# Patient Record
Sex: Male | Born: 1985 | Race: White | Hispanic: No | Marital: Married | Smoking: Former smoker
Health system: Southern US, Community
[De-identification: ages and names within clinical notes are randomized; demographics above are authoritative.]

## PROBLEM LIST (undated history)

## (undated) DIAGNOSIS — J45909 Unspecified asthma, uncomplicated: Secondary | ICD-10-CM

## (undated) HISTORY — DX: Unspecified asthma, uncomplicated: J45.909

---
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
## 2008-07-25 ENCOUNTER — Ambulatory Visit: Payer: Self-pay | Admitting: Internal Medicine

## 2008-08-28 ENCOUNTER — Ambulatory Visit: Payer: Self-pay | Admitting: Family Medicine

## 2008-11-24 ENCOUNTER — Emergency Department: Payer: Self-pay | Admitting: Emergency Medicine

## 2009-01-05 ENCOUNTER — Emergency Department: Payer: Self-pay | Admitting: Emergency Medicine

## 2009-04-01 ENCOUNTER — Ambulatory Visit: Payer: Self-pay | Admitting: Family Medicine

## 2009-04-03 ENCOUNTER — Ambulatory Visit: Payer: Self-pay | Admitting: Internal Medicine

## 2012-05-29 ENCOUNTER — Ambulatory Visit: Payer: Self-pay | Admitting: Specialist

## 2013-10-24 DIAGNOSIS — J4520 Mild intermittent asthma, uncomplicated: Secondary | ICD-10-CM | POA: Insufficient documentation

## 2014-05-20 IMAGING — CT CT CHEST W/O CM
1 series · 16 of 33 positions shown, 20 images · non-contrast
Comparison: none

REASON FOR EXAM: lung density
COMMENTS:

[Series 2: soft tissue · axial · 0.75mm/px · z∈[-144,+208]mm · 16 of 127 slices shown, 20 images]
[im 5/127  mediastinal]
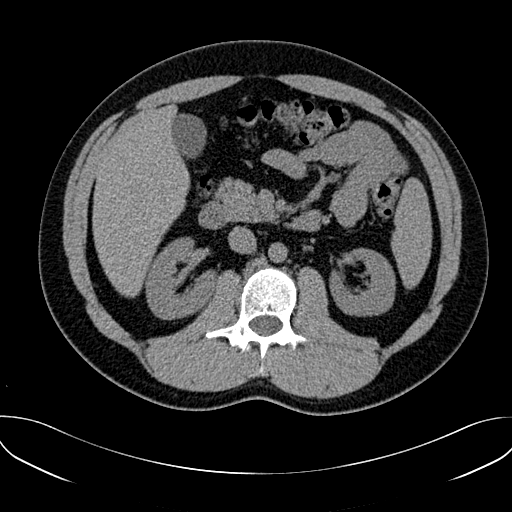
[im 5/127  lung]
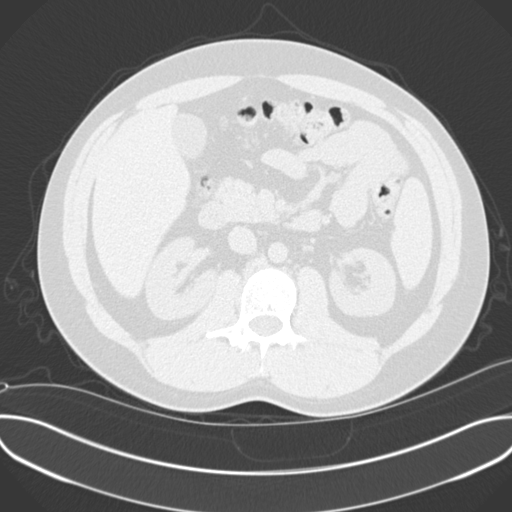
[im 15/127  lung]
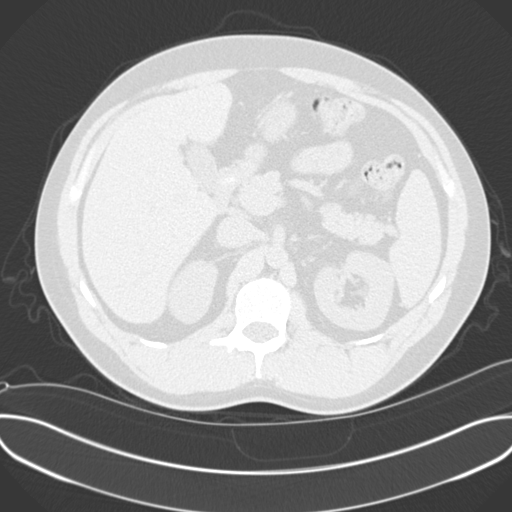
[im 24/127  lung]
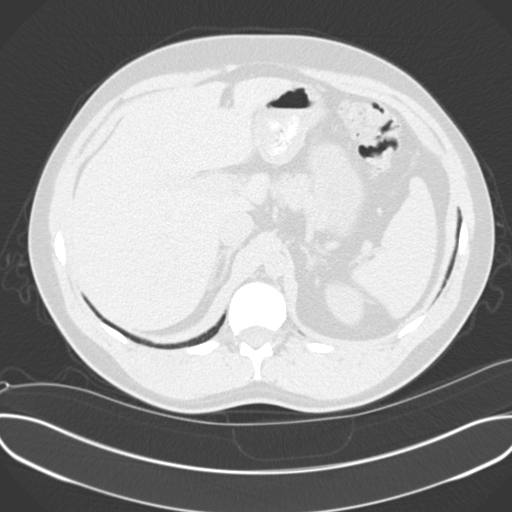
[im 29/127  lung]
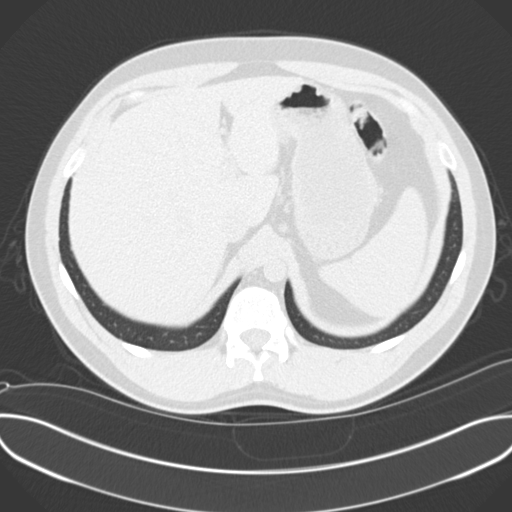
[im 38/127  mediastinal]
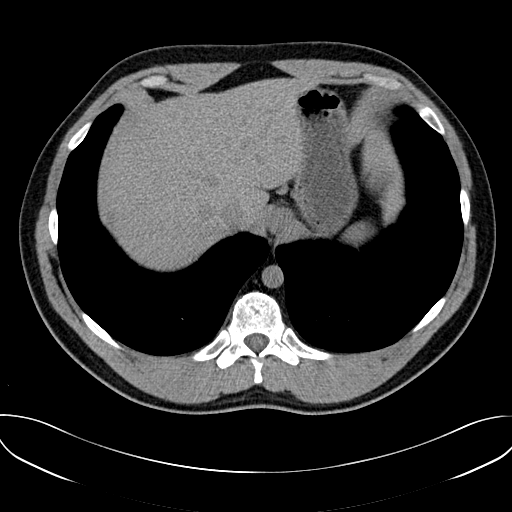
[im 38/127  lung]
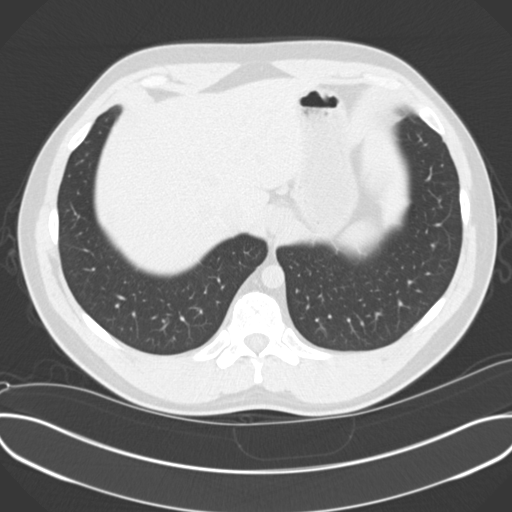
[im 47/127  lung]
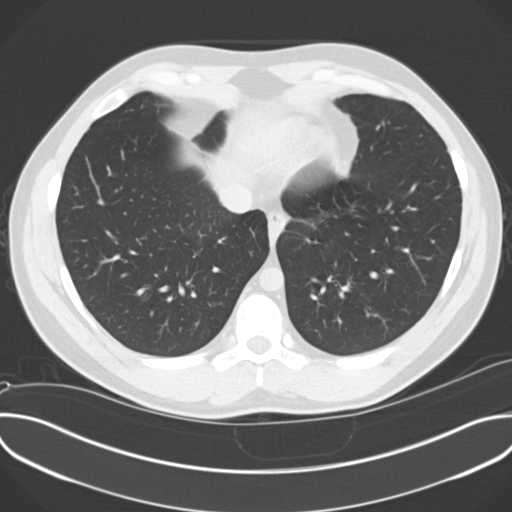
[im 52/127  lung]
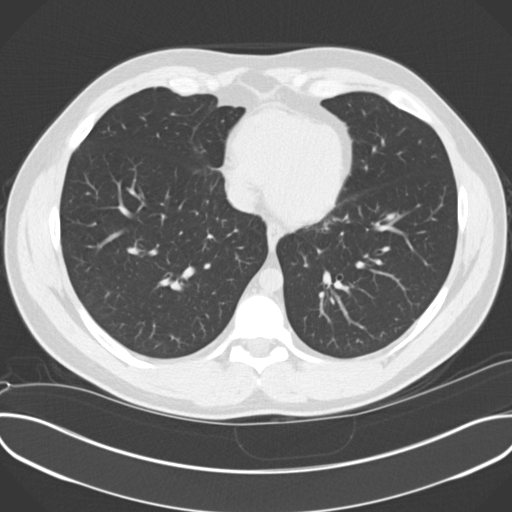
[im 61/127  lung]
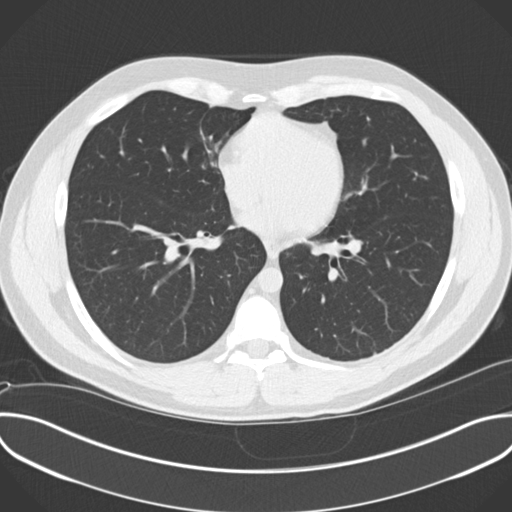
[im 67/127  mediastinal]
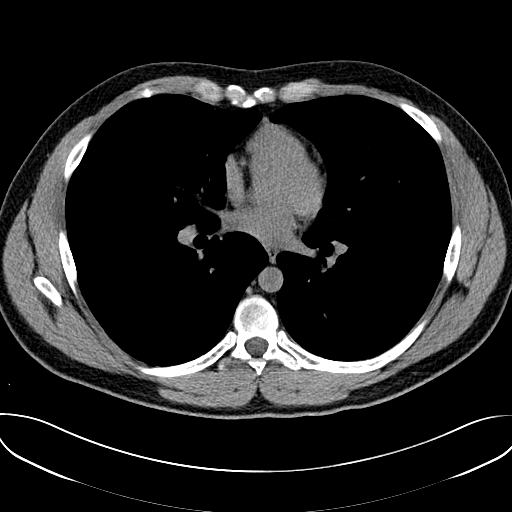
[im 67/127  lung]
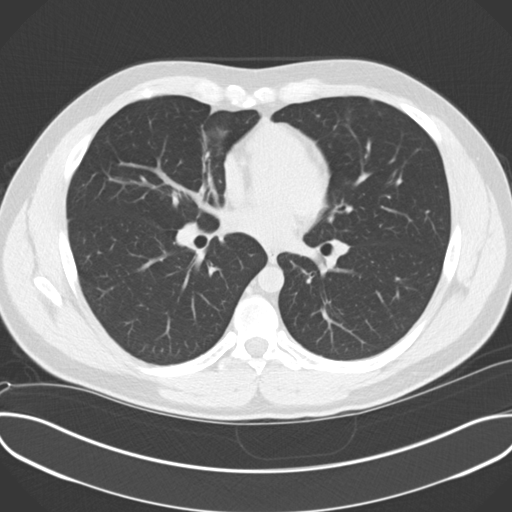
[im 75/127  lung]
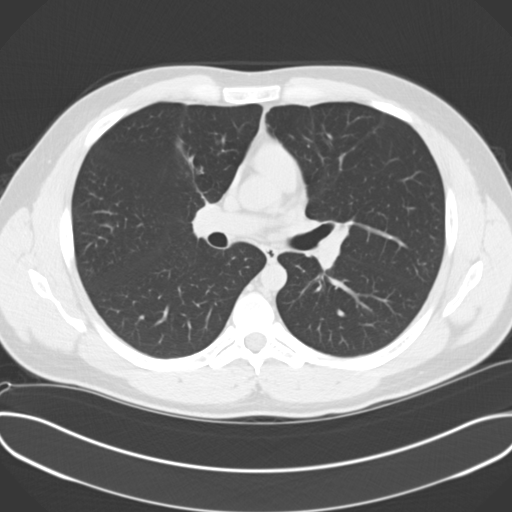
[im 80/127  lung]
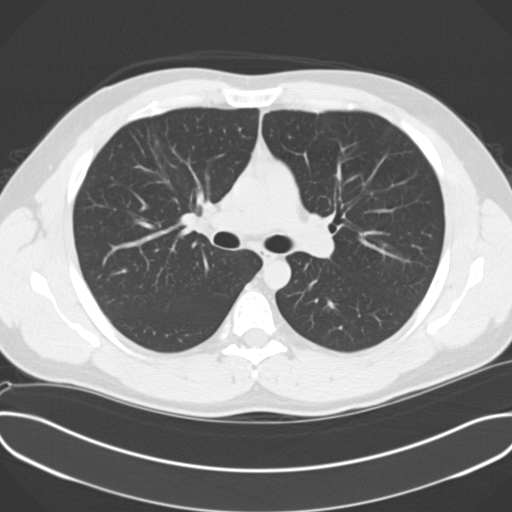
[im 89/127  lung]
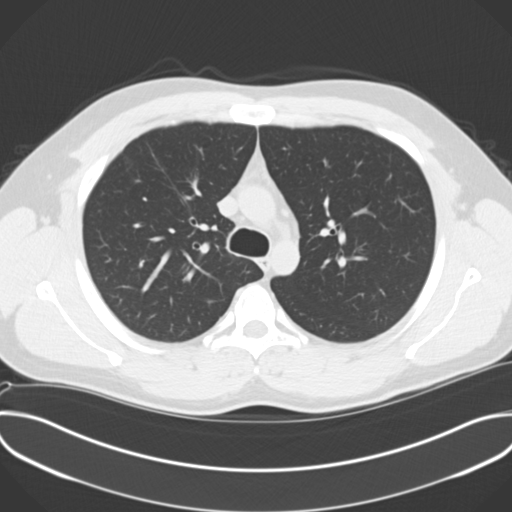
[im 99/127  mediastinal]
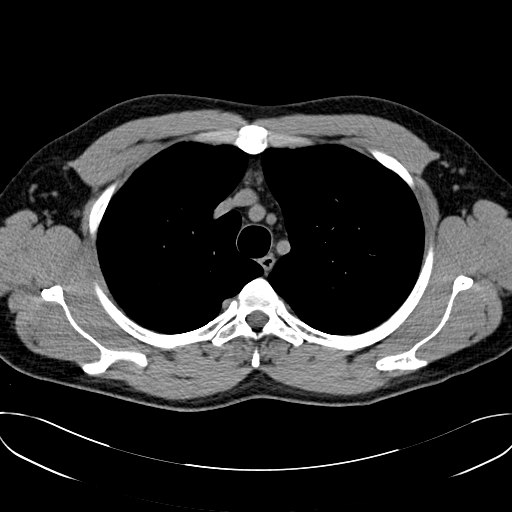
[im 99/127  lung]
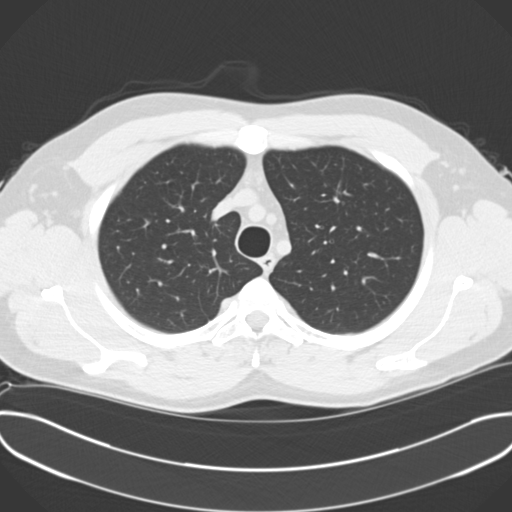
[im 103/127  lung]
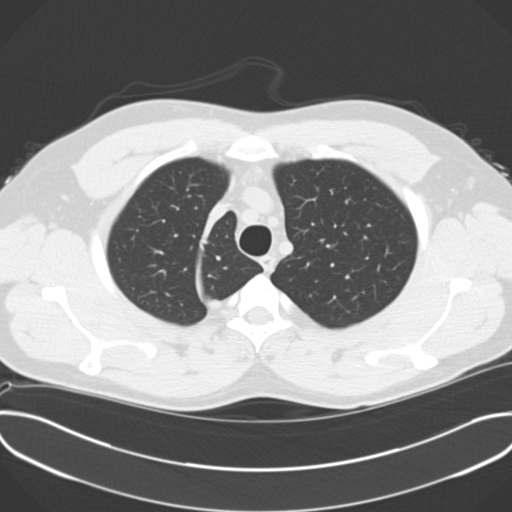
[im 113/127  lung]
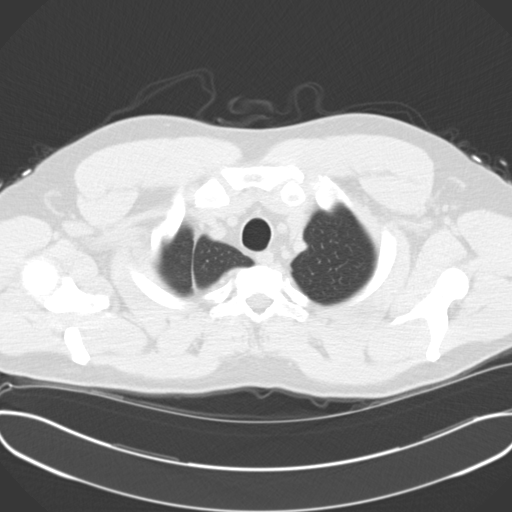
[im 122/127  lung]
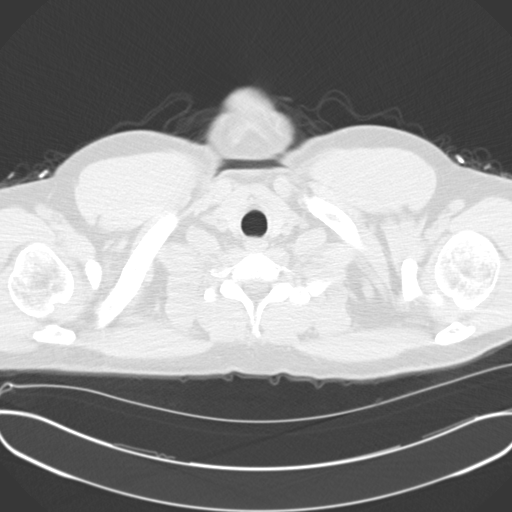

[16 of 33 positions shown; findings below may reference images not displayed]

PROCEDURE:     CT  - CT CHEST WITHOUT CONTRAST  - May 29, 2012  [DATE]

RESULT:     Chest CT without contrast is reconstructed in the axial plane at
3 mm slice thickness. There is no recent study for comparison.

There is no pleural or pericardial effusion. There is no mediastinal or
hilar mass or adenopathy. Is no hiatal hernia. The esophagus appears to be
unremarkable. There is an azygos lobe configuration. There appears to be a
band of thickening in the right upper lobe which could represent fibrosis.
There is no discrete mass. There is no infiltrate, effusion or pneumothorax.
No central endobronchial lesion is evident. The included upper abdominal
structures appear unremarkable. The included portions of the thyroid gland
appear to be unremarkable. No axillary adenopathy is evident. The bony
structures appear within normal limits.
IMPRESSION: 1. Azygos lobe configuration. Presumed fibrosis in the right upper lobe. No
discrete mass evident.

[REDACTED]

## 2016-07-01 ENCOUNTER — Ambulatory Visit: Payer: Self-pay | Admitting: Medical

## 2016-07-01 ENCOUNTER — Encounter: Payer: Self-pay | Admitting: Medical

## 2016-07-01 VITALS — BP 125/78 | HR 95 | Temp 98.5°F | Resp 16 | Ht 70.0 in | Wt 194.0 lb

## 2016-07-01 DIAGNOSIS — J4520 Mild intermittent asthma, uncomplicated: Secondary | ICD-10-CM

## 2016-07-01 DIAGNOSIS — F419 Anxiety disorder, unspecified: Secondary | ICD-10-CM

## 2016-07-01 NOTE — Progress Notes (Signed)
   Subjective:    Patient ID: Antonio Rollins, male    DOB: 09-01-1985, 31 y.o.   MRN: [id number]  HPI 31 yo male  presents with  Anxiety issues which he says has been worse over the last year. His wife and coworkers have also mentioned his anxiety. He Thinks he has been dealing with this since high school. Leaves for vaction next week returns on the  4th of July. Hx Asthma. He says he does not like doctors and coming into the clinic today is a big step for him.He denies any depression.   2 weeks ago  With runny nose , hot and sweaty now symptoms have resolved.  Review of Systems  HENT: Negative for congestion, ear pain and sore throat.   Eyes: Negative for discharge and itching.  Respiratory: Positive for shortness of breath. Negative for cough.   Cardiovascular: Negative for chest pain.  Gastrointestinal: Negative for diarrhea, nausea and vomiting.  Endocrine: Negative for polydipsia, polyphagia and polyuria.  Genitourinary: Negative for hematuria.  Musculoskeletal: Negative for arthralgias and myalgias.  Skin: Negative for rash.  Allergic/Immunologic: Positive for environmental allergies. Negative for food allergies.  Neurological: Positive for dizziness. Negative for syncope.  Hematological: Negative for adenopathy.  Psychiatric/Behavioral: Negative for confusion, hallucinations, self-injury and suicidal ideas. The patient is nervous/anxious.    Gets sob no wheezing  (mild) with humid/hot outside. Sometimes dizzy with anxiety.    Objective:   Physical Exam  Constitutional: He is oriented to person, place, and time. Vital signs are normal. He appears well-developed and well-nourished.  HENT:  Head: Normocephalic and atraumatic.  Eyes: Conjunctivae and EOM are normal. Pupils are equal, round, and reactive to light.  Neck: Normal range of motion.  Cardiovascular: Normal rate, regular rhythm and normal heart sounds.  Exam reveals no gallop and no friction rub.   No murmur  heard. Pulmonary/Chest: Effort normal and breath sounds normal.  Neurological: He is alert and oriented to person, place, and time.  Skin: Skin is warm and dry.  Psychiatric: He has a normal mood and affect. His behavior is normal. Judgment and thought content normal.  Nursing note and vitals reviewed.  He appears calm to day.       Assessment & Plan:  Anxiety I counseled patient on anxiety today. He feels he gets irritated with the people he works with and that is what causes his anxiety.  For now the patient would like to try [facility] [employer] [facility] counseling services.. I did offer hydroxyzine but he declines. His GAD score today was  13. He is to follow up in 7-10 days for a recheck.

## 2016-07-04 DIAGNOSIS — J45909 Unspecified asthma, uncomplicated: Secondary | ICD-10-CM | POA: Insufficient documentation

## 2016-07-20 ENCOUNTER — Encounter: Payer: Self-pay | Admitting: Medical

## 2016-07-20 ENCOUNTER — Ambulatory Visit: Payer: Self-pay | Admitting: Medical

## 2016-07-20 VITALS — BP 105/60 | HR 88 | Temp 98.6°F | Resp 16 | Ht 70.0 in | Wt 194.0 lb

## 2016-07-20 DIAGNOSIS — F419 Anxiety disorder, unspecified: Secondary | ICD-10-CM

## 2016-07-20 MED ORDER — SERTRALINE HCL 25 MG PO TABS: 25.0000 mg | ORAL_TABLET | Freq: Every day | ORAL | 0 refills | Status: DC

## 2016-07-20 NOTE — Patient Instructions (Addendum)
Schedule for primary care labs and then set up for primary care appointment. One week follow up .

## 2016-07-20 NOTE — Progress Notes (Signed)
   Subjective:    Patient ID: Antonio Rollins, male    DOB: Aug 16, 1985, 31 y.o.   MRN: [id number]  HPI  Returns to day for anxiety issues recheck . [place] family of 12 people. Feels he did have some anxiety, tried doing yoga with little sister but still felt he had a lot of anxieity.  Back  To work last  2 days.  A couple of people at work stress him out he is trying to avoid them but still feels stressed.  Patients wife is on Paxil and feels it works wells.  But has read up on a lot of different SSRI and is willing to try any one I suggest. Sees pulmonologist . Dr Meredeth IdeFleming for asthma.   Review of Systems  Constitutional: Negative for chills and fever.  HENT: Negative for congestion, facial swelling and sore throat.   Eyes: Negative for discharge and itching.  Respiratory: Negative for cough.   Cardiovascular: Negative for chest pain.  Gastrointestinal: Negative for abdominal pain.  Genitourinary: Negative for hematuria.  Musculoskeletal: Negative for back pain.  Skin: Negative for rash.  Neurological: Negative for dizziness and syncope.  Psychiatric/Behavioral: Negative for confusion and hallucinations. The patient is not hyperactive.      Shortness of breath after returning from vacation, used Proair and it resolved. On occasion you get with anxiety, or when the air quality is bad he then also uses his Proair and it resolves.  mild sunburn on shoulders from sunburn from vacation . Azygos lobe unsure of side. ( extra lobe upper right lobe) Objective:   Physical Exam  Constitutional: He is oriented to person, place, and time. He appears well-developed and well-nourished.  HENT:  Head: Normocephalic and atraumatic.  Eyes: Conjunctivae and EOM are normal.  Neck: Normal range of motion.  Cardiovascular: Normal rate, regular rhythm and normal heart sounds.  Exam reveals no gallop and no friction rub.   No murmur heard. Pulmonary/Chest: Effort  normal and breath sounds normal.  Neurological: He is alert and oriented to person, place, and time.  Skin: Skin is warm and dry.  Psychiatric: He has a normal mood and affect. His behavior is normal. Judgment and thought content normal.  Nursing note and vitals reviewed.  Calmly talking to me today.  GAD-7 = 16  3 highter than his last visit 07/01/16.     Assessment & Plan:   Anxiety  E prescribed Zoloft  25 mg one tablet by mouth daily #30 no refills. Reviewed side effects with patient. Follow up in one week. If any concerns with medication to call or come into the clinic sooner.  He verbalizes understanding and has no questions at discharge.

## 2016-07-28 ENCOUNTER — Ambulatory Visit: Payer: Self-pay | Admitting: Medical

## 2016-07-28 ENCOUNTER — Other Ambulatory Visit: Payer: Self-pay

## 2016-07-28 ENCOUNTER — Encounter: Payer: Self-pay | Admitting: Medical

## 2016-07-28 VITALS — BP 98/66 | HR 78 | Temp 98.0°F

## 2016-07-28 DIAGNOSIS — Z0000 Encounter for general adult medical examination without abnormal findings: Secondary | ICD-10-CM

## 2016-07-28 DIAGNOSIS — F419 Anxiety disorder, unspecified: Secondary | ICD-10-CM

## 2016-07-28 LAB — POCT URINALYSIS DIPSTICK
Bilirubin, UA: NEGATIVE
Glucose, UA: NEGATIVE
Ketones, UA: NEGATIVE
Leukocytes, UA: NEGATIVE
Nitrite, UA: NEGATIVE
PH UA: 6 (ref 5.0–8.0)
Protein, UA: NEGATIVE
RBC UA: NEGATIVE
Spec Grav, UA: 1.01 (ref 1.010–1.025)
UROBILINOGEN UA: 0.2 U/dL

## 2016-07-28 NOTE — Patient Instructions (Addendum)
Schedule a  2 week follow up. Sooner if any concerns.   Generalized Anxiety Disorder, Adult Generalized anxiety disorder (GAD) is a mental health disorder. People with this condition constantly worry about everyday events. Unlike normal anxiety, worry related to GAD is not triggered by a specific event. These worries also do not fade or get better with time. GAD interferes with life functions, including relationships, work, and school. GAD can vary from mild to severe. People with severe GAD can have intense waves of anxiety with physical symptoms (panic attacks). What are the causes? The exact cause of GAD is not known. What increases the risk? This condition is more likely to develop in:  Women.  People who have a family history of anxiety disorders.  People who are very shy.  People who experience very stressful life events, such as the death of a loved one.  People who have a very stressful family environment.  What are the signs or symptoms? People with GAD often worry excessively about many things in their lives, such as their health and family. They may also be overly concerned about:  Doing well at work.  Being on time.  Natural disasters.  Friendships.  Physical symptoms of GAD include:  Fatigue.  Muscle tension or having muscle twitches.  Trembling or feeling shaky.  Being easily startled.  Feeling like your heart is pounding or racing.  Feeling out of breath or like you cannot take a deep breath.  Having trouble falling asleep or staying asleep.  Sweating.  Nausea, diarrhea, or irritable bowel syndrome (IBS).  Headaches.  Trouble concentrating or remembering facts.  Restlessness.  Irritability.  How is this diagnosed? Your health care provider can diagnose GAD based on your symptoms and medical history. You will also have a physical exam. The health care provider will ask specific questions about your symptoms, including how severe they are,  when they started, and if they come and go. Your health care provider may ask you about your use of alcohol or drugs, including prescription medicines. Your health care provider may refer you to a mental health specialist for further evaluation. Your health care provider will do a thorough examination and may perform additional tests to rule out other possible causes of your symptoms. To be diagnosed with GAD, a person must have anxiety that:  Is out of his or her control.  Affects several different aspects of his or her life, such as work and relationships.  Causes distress that makes him or her unable to take part in normal activities.  Includes at least three physical symptoms of GAD, such as restlessness, fatigue, trouble concentrating, irritability, muscle tension, or sleep problems.  Before your health care provider can confirm a diagnosis of GAD, these symptoms must be present more days than they are not, and they must last for six months or longer. How is this treated? The following therapies are usually used to treat GAD:  Medicine. Antidepressant medicine is usually prescribed for long-term daily control. Antianxiety medicines may be added in severe cases, especially when panic attacks occur.  Talk therapy (psychotherapy). Certain types of talk therapy can be helpful in treating GAD by providing support, education, and guidance. Options include: ? Cognitive behavioral therapy (CBT). People learn coping skills and techniques to ease their anxiety. They learn to identify unrealistic or negative thoughts and behaviors and to replace them with positive ones. ? Acceptance and commitment therapy (ACT). This treatment teaches people how to be mindful as a way to  cope with unwanted thoughts and feelings. ? Biofeedback. This process trains you to manage your body's response (physiological response) through breathing techniques and relaxation methods. You will work with a therapist while  machines are used to monitor your physical symptoms.  Stress management techniques. These include yoga, meditation, and exercise.  A mental health specialist can help determine which treatment is best for you. Some people see improvement with one type of therapy. However, other people require a combination of therapies. Follow these instructions at home:  Take over-the-counter and prescription medicines only as told by your health care provider.  Try to maintain a normal routine.  Try to anticipate stressful situations and allow extra time to manage them.  Practice any stress management or self-calming techniques as taught by your health care provider.  Do not punish yourself for setbacks or for not making progress.  Try to recognize your accomplishments, even if they are small.  Keep all follow-up visits as told by your health care provider. This is important. Contact a health care provider if:  Your symptoms do not get better.  Your symptoms get worse.  You have signs of depression, such as: ? A persistently sad, cranky, or irritable mood. ? Loss of enjoyment in activities that used to bring you joy. ? Change in weight or eating. ? Changes in sleeping habits. ? Avoiding friends or family members. ? Loss of energy for normal tasks. ? Feelings of guilt or worthlessness. Get help right away if:  You have serious thoughts about hurting yourself or others. If you ever feel like you may hurt yourself or others, or have thoughts about taking your own life, get help right away. You can go to your nearest emergency department or call:  Your local emergency services (911 in the [place]  A suicide crisis helpline, such as the National Suicide Prevention Lifeline at 630-581-20081-210-093-4295. This is open 24 hours a day.  Summary  Generalized anxiety disorder (GAD) is a mental health disorder that involves worry that is not triggered by a specific event.  People with GAD often worry  excessively about many things in their lives, such as their health and family.  GAD may cause physical symptoms such as restlessness, trouble concentrating, sleep problems, frequent sweating, nausea, diarrhea, headaches, and trembling or muscle twitching.  A mental health specialist can help determine which treatment is best for you. Some people see improvement with one type of therapy. However, other people require a combination of therapies. This information is not intended to replace advice given to you by your health care provider. Make sure you discuss any questions you have with your health care provider. Document Released: 05/01/2012 Document Revised: 11/25/2015 Document Reviewed: 11/25/2015 Elsevier Interactive Patient Education  [employer]2018 [employer]

## 2016-07-28 NOTE — Progress Notes (Signed)
   Subjective:    Patient ID: Antonio Rollins, male    DOB: 11/10/1985, 31 y.o.   MRN: [id number]  HPI 31 yo male comes in for recheck of anxiety , started on zoloft one week ago. Feeling better, taking medication, doing yoga, exercising more.  Having trouble with seeing counselor due to time in schedule.  Wife restarting classes,  Studying for her masters in Psychology  and has a daughter  462 yo. Which he has to pick her up  2 times per week. So scheduling is difficulty.  Got hot and sweating after gave the blood for blood work.  Felt dizzy like he was going to pass out,  Laid down and ate a protein bar and had some gatorade and then felt better. Now feels back to his baseline.   Review of Systems  Constitutional: Negative for chills and fever.  HENT: Negative for congestion, ear pain and sore throat.   Eyes: Negative for discharge and itching.  Respiratory: Negative for cough.   Cardiovascular: Negative for chest pain.  Gastrointestinal: Negative for abdominal pain.  Endocrine: Negative for cold intolerance and heat intolerance.  Genitourinary: Negative for dysuria and hematuria.  Musculoskeletal: Negative for myalgias.  Skin: Negative for rash.  Neurological: Positive for dizziness. Negative for syncope.  Hematological: Negative for adenopathy.  Psychiatric/Behavioral: Negative for agitation, behavioral problems, self-injury and suicidal ideas. The patient is nervous/anxious.    With anxiety he was getting short of breath and this seems to have improved.   Anxiety is improving  He says it like  "night and day on how he feels now" feeling much better denies hearing voices. Objective:   Physical Exam  Constitutional: He is oriented to person, place, and time. He appears well-developed and well-nourished.  HENT:  Head: Normocephalic and atraumatic.  Eyes: EOM are normal. Pupils are equal, round, and reactive to light.  Neck: Normal range of motion.  Cardiovascular: Normal rate.   Exam reveals no gallop and no friction rub.   No murmur heard. Pulmonary/Chest: Effort normal and breath sounds normal.  Neurological: He is alert and oriented to person, place, and time.  Skin: Skin is warm and dry.  Psychiatric: He has a normal mood and affect. His behavior is normal. Judgment and thought content normal.  Nursing note and vitals reviewed.     Recheck bp 120 /88 left arm blue cuff    Assessment & Plan:  Anxiety improved he is on Zoloft 25 mg  Per day and he feels this dosage is working well for him. GAD-7 score of 10 , last week he was  16. He is to return to the clinic in 2 weeks for a recheck most likely will need a refill., to follow up sooner if any concerns.

## 2016-07-29 LAB — CMP12+LP+TP+TSH+6AC+CBC/D/PLT
ALT: 44 IU/L (ref 0–44)
AST: 30 IU/L (ref 0–40)
Albumin/Globulin Ratio: 1.7 (ref 1.2–2.2)
Albumin: 4.6 g/dL (ref 3.5–5.5)
Alkaline Phosphatase: 68 IU/L (ref 39–117)
BUN/Creatinine Ratio: 12 (ref 9–20)
BUN: 13 mg/dL (ref 6–20)
Basophils Absolute: 0 10*3/uL (ref 0.0–0.2)
Basos: 0 %
Bilirubin Total: 0.4 mg/dL (ref 0.0–1.2)
Calcium: 9.9 mg/dL (ref 8.7–10.2)
Chloride: 101 mmol/L (ref 96–106)
Chol/HDL Ratio: 5.4 ratio — ABNORMAL HIGH (ref 0.0–5.0)
Cholesterol, Total: 256 mg/dL — ABNORMAL HIGH (ref 100–199)
Creatinine, Ser: 1.09 mg/dL (ref 0.76–1.27)
EOS (ABSOLUTE): 0.3 10*3/uL (ref 0.0–0.4)
Eos: 3 %
Estimated CHD Risk: 1.1 times avg. — ABNORMAL HIGH (ref 0.0–1.0)
Free Thyroxine Index: 1.7 (ref 1.2–4.9)
GFR calc Af Amer: 103 mL/min/{1.73_m2} (ref >59)
GFR calc non Af Amer: 89 mL/min/{1.73_m2} (ref >59)
GGT: 30 IU/L (ref 0–65)
Globulin, Total: 2.7 g/dL (ref 1.5–4.5)
Glucose: 85 mg/dL (ref 65–99)
HDL: 47 mg/dL (ref >39)
Hematocrit: 48.5 % (ref 37.5–51.0)
Hemoglobin: 16.1 g/dL (ref 13.0–17.7)
Immature Grans (Abs): 0 10*3/uL (ref 0.0–0.1)
Immature Granulocytes: 0 %
Iron: 115 ug/dL (ref 38–169)
LDH: 206 IU/L (ref 121–224)
LDL Calculated: 165 mg/dL — ABNORMAL HIGH (ref 0–99)
Lymphocytes Absolute: 3.6 10*3/uL — ABNORMAL HIGH (ref 0.7–3.1)
Lymphs: 35 %
MCH: 29.4 pg (ref 26.6–33.0)
MCHC: 33.2 g/dL (ref 31.5–35.7)
MCV: 89 fL (ref 79–97)
Monocytes Absolute: 0.6 10*3/uL (ref 0.1–0.9)
Monocytes: 6 %
Neutrophils Absolute: 5.7 10*3/uL (ref 1.4–7.0)
Neutrophils: 56 %
Phosphorus: 3.6 mg/dL (ref 2.5–4.5)
Platelets: 261 10*3/uL (ref 150–379)
Potassium: 4.6 mmol/L (ref 3.5–5.2)
RBC: 5.47 x10E6/uL (ref 4.14–5.80)
RDW: 13.3 % (ref 12.3–15.4)
Sodium: 141 mmol/L (ref 134–144)
T3 Uptake Ratio: 22 % — ABNORMAL LOW (ref 24–39)
T4, Total: 7.8 ug/dL (ref 4.5–12.0)
TSH: 1.47 u[IU]/mL (ref 0.450–4.500)
Total Protein: 7.3 g/dL (ref 6.0–8.5)
Triglycerides: 220 mg/dL — ABNORMAL HIGH (ref 0–149)
Uric Acid: 6.6 mg/dL (ref 3.7–8.6)
VLDL Cholesterol Cal: 44 mg/dL — ABNORMAL HIGH (ref 5–40)
WBC: 10.3 10*3/uL (ref 3.4–10.8)

## 2016-07-29 LAB — VITAMIN D 25 HYDROXY (VIT D DEFICIENCY, FRACTURES): Vit D, 25-Hydroxy: 22.6 ng/mL — ABNORMAL LOW (ref 30.0–100.0)

## 2016-08-11 ENCOUNTER — Ambulatory Visit: Payer: Self-pay | Admitting: Medical

## 2016-08-11 DIAGNOSIS — F411 Generalized anxiety disorder: Secondary | ICD-10-CM

## 2016-08-11 NOTE — Progress Notes (Signed)
   Subjective:    Patient ID: Antonio Rollins, male    DOB: 07/01/1985, 31 y.o.   MRN: [id number]  HPI 31 yo male returns for follow up  After being started on Zoloft 25 mg 3.5 weeks ago he decided one week ago to increase dosage to 50 mg per my advice if needed. Helped more with his anxiety. 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 Going bed about 10:30pm trouble going to sleep and waking up about 3-4 times per night. No fatigue during the day.  Groggy in the morning using energy shots  " like 5  Hour energy uses  1/2 of a shot" . Drinking coffee int he morning which is not his usual. Noticed this after increasing the Zoloft.    Review of Systems  Constitutional: Negative for activity change, appetite change, chills, fatigue and fever.  HENT: Negative for congestion, ear pain and sore throat.   Eyes: Negative for discharge, itching and visual disturbance.  Respiratory: Negative for cough and shortness of breath.   Cardiovascular: Negative for chest pain, palpitations and leg swelling.  Gastrointestinal: Negative for abdominal pain and constipation.  Endocrine: Negative for polydipsia, polyphagia and polyuria.  Genitourinary: Negative for dysuria and hematuria.  Musculoskeletal: Negative for myalgias.  Skin: Negative for rash.  Neurological: Negative for dizziness, syncope, light-headedness and headaches.  Hematological: Negative for adenopathy.  Psychiatric/Behavioral: Positive for sleep disturbance. Negative for agitation, behavioral problems, confusion, decreased concentration, dysphoric mood, hallucinations, self-injury and suicidal ideas. The patient is not nervous/anxious and is not hyperactive.   sleep disturbance as above      Objective:   Physical Exam  Constitutional: He is oriented to person, place, and time. He appears well-developed and well-nourished.  HENT:  Head: Normocephalic and atraumatic.  Eyes:  Pupils are equal, round, and reactive to light. Conjunctivae and EOM are normal.  Cardiovascular: Normal rate, regular rhythm and normal heart sounds.  Exam reveals no gallop and no friction rub.   No murmur heard. Pulmonary/Chest: Effort normal and breath sounds normal.  Neurological: He is alert and oriented to person, place, and time.  Skin: Skin is warm and dry.  Psychiatric: He has a normal mood and affect. His behavior is normal. Judgment and thought content normal.  Nursing note and vitals reviewed.         Assessment & Plan:  Anxiety much improved.  Sleep disturbances , given sleep hygiene information. Continue Zoloft  50 mg / day  GAD-7 score of  3. Reviewed labs, recheck of lipid panel in 6 months. Reviewed diet changes., states he does eat a lot of fried foods, fast food , as well.  Primary care appointment to be scheduled and follow up in 2 weeks on his anxiety. rtc sooner if needed.

## 2016-08-11 NOTE — Patient Instructions (Addendum)
Set up primary care appointment and return in  2 weeks. Return sooner to the clinic if needed.    Generalized Anxiety Disorder, Adult Generalized anxiety disorder (GAD) is a mental health disorder. People with this condition constantly worry about everyday events. Unlike normal anxiety, worry related to GAD is not triggered by a specific event. These worries also do not fade or get better with time. GAD interferes with life functions, including relationships, work, and school. GAD can vary from mild to severe. People with severe GAD can have intense waves of anxiety with physical symptoms (panic attacks). What are the causes? The exact cause of GAD is not known. What increases the risk? This condition is more likely to develop in:  Women.  People who have a family history of anxiety disorders.  People who are very shy.  People who experience very stressful life events, such as the death of a loved one.  People who have a very stressful family environment.  What are the signs or symptoms? People with GAD often worry excessively about many things in their lives, such as their health and family. They may also be overly concerned about:  Doing well at work.  Being on time.  Natural disasters.  Friendships.  Physical symptoms of GAD include:  Fatigue.  Muscle tension or having muscle twitches.  Trembling or feeling shaky.  Being easily startled.  Feeling like your heart is pounding or racing.  Feeling out of breath or like you cannot take a deep breath.  Having trouble falling asleep or staying asleep.  Sweating.  Nausea, diarrhea, or irritable bowel syndrome (IBS).  Headaches.  Trouble concentrating or remembering facts.  Restlessness.  Irritability.  How is this diagnosed? Your health care provider can diagnose GAD based on your symptoms and medical history. You will also have a physical exam. The health care provider will ask specific questions about your  symptoms, including how severe they are, when they started, and if they come and go. Your health care provider may ask you about your use of alcohol or drugs, including prescription medicines. Your health care provider may refer you to a mental health specialist for further evaluation. Your health care provider will do a thorough examination and may perform additional tests to rule out other possible causes of your symptoms. To be diagnosed with GAD, a person must have anxiety that:  Is out of his or her control.  Affects several different aspects of his or her life, such as work and relationships.  Causes distress that makes him or her unable to take part in normal activities.  Includes at least three physical symptoms of GAD, such as restlessness, fatigue, trouble concentrating, irritability, muscle tension, or sleep problems.  Before your health care provider can confirm a diagnosis of GAD, these symptoms must be present more days than they are not, and they must last for six months or longer. How is this treated? The following therapies are usually used to treat GAD:  Medicine. Antidepressant medicine is usually prescribed for long-term daily control. Antianxiety medicines may be added in severe cases, especially when panic attacks occur.  Talk therapy (psychotherapy). Certain types of talk therapy can be helpful in treating GAD by providing support, education, and guidance. Options include: ? Cognitive behavioral therapy (CBT). People learn coping skills and techniques to ease their anxiety. They learn to identify unrealistic or negative thoughts and behaviors and to replace them with positive ones. ? Acceptance and commitment therapy (ACT). This treatment teaches people  how to be mindful as a way to cope with unwanted thoughts and feelings. ? Biofeedback. This process trains you to manage your body's response (physiological response) through breathing techniques and relaxation methods. You  will work with a therapist while machines are used to monitor your physical symptoms.  Stress management techniques. These include yoga, meditation, and exercise.  A mental health specialist can help determine which treatment is best for you. Some people see improvement with one type of therapy. However, other people require a combination of therapies. Follow these instructions at home:  Take over-the-counter and prescription medicines only as told by your health care provider.  Try to maintain a normal routine.  Try to anticipate stressful situations and allow extra time to manage them.  Practice any stress management or self-calming techniques as taught by your health care provider.  Do not punish yourself for setbacks or for not making progress.  Try to recognize your accomplishments, even if they are small.  Keep all follow-up visits as told by your health care provider. This is important. Contact a health care provider if:  Your symptoms do not get better.  Your symptoms get worse.  You have signs of depression, such as: ? A persistently sad, cranky, or irritable mood. ? Loss of enjoyment in activities that used to bring you joy. ? Change in weight or eating. ? Changes in sleeping habits. ? Avoiding friends or family members. ? Loss of energy for normal tasks. ? Feelings of guilt or worthlessness. Get help right away if:  You have serious thoughts about hurting yourself or others. If you ever feel like you may hurt yourself or others, or have thoughts about taking your own life, get help right away. You can go to your nearest emergency department or call:  Your local emergency services (911 in the [place]  A suicide crisis helpline, such as the National Suicide Prevention Lifeline at 425-454-9469. This is open 24 hours a day.  Summary  Generalized anxiety disorder (GAD) is a mental health disorder that involves worry that is not triggered by a specific  event.  People with GAD often worry excessively about many things in their lives, such as their health and family.  GAD may cause physical symptoms such as restlessness, trouble concentrating, sleep problems, frequent sweating, nausea, diarrhea, headaches, and trembling or muscle twitching.  A mental health specialist can help determine which treatment is best for you. Some people see improvement with one type of therapy. However, other people require a combination of therapies. This information is not intended to replace advice given to you by your health care provider. Make sure you discuss any questions you have with your health care provider. Document Released: 05/01/2012 Document Revised: 11/25/2015 Document Reviewed: 11/25/2015 Elsevier Interactive Patient Education  2018 [employer]. Insomnia Insomnia is a sleep disorder that makes it difficult to fall asleep or to stay asleep. Insomnia can cause tiredness (fatigue), low energy, difficulty concentrating, mood swings, and poor performance at work or school. There are three different ways to classify insomnia:  Difficulty falling asleep.  Difficulty staying asleep.  Waking up too early in the morning.  Any type of insomnia can be long-term (chronic) or short-term (acute). Both are common. Short-term insomnia usually lasts for three months or less. Chronic insomnia occurs at least three times a week for longer than three months. What are the causes? Insomnia may be caused by another condition, situation, or substance, such as:  Anxiety.  Certain medicines.  Gastroesophageal reflux  disease (GERD) or other gastrointestinal conditions.  Asthma or other breathing conditions.  Restless legs syndrome, sleep apnea, or other sleep disorders.  Chronic pain.  Menopause. This may include hot flashes.  Stroke.  Abuse of alcohol, tobacco, or illegal drugs.  Depression.  Caffeine.  Neurological disorders, such as Alzheimer  disease.  An overactive thyroid (hyperthyroidism).  The cause of insomnia may not be known. What increases the risk? Risk factors for insomnia include:  Gender. Women are more commonly affected than men.  Age. Insomnia is more common as you get older.  Stress. This may involve your professional or personal life.  Income. Insomnia is more common in people with lower income.  Lack of exercise.  Irregular work schedule or night shifts.  Traveling between different time zones.  What are the signs or symptoms? If you have insomnia, trouble falling asleep or trouble staying asleep is the main symptom. This may lead to other symptoms, such as:  Feeling fatigued.  Feeling nervous about going to sleep.  Not feeling rested in the morning.  Having trouble concentrating.  Feeling irritable, anxious, or depressed.  How is this treated? Treatment for insomnia depends on the cause. If your insomnia is caused by an underlying condition, treatment will focus on addressing the condition. Treatment may also include:  Medicines to help you sleep.  Counseling or therapy.  Lifestyle adjustments.  Follow these instructions at home:  Take medicines only as directed by your health care provider.  Keep regular sleeping and waking hours. Avoid naps.  Keep a sleep diary to help you and your health care provider figure out what could be causing your insomnia. Include: ? When you sleep. ? When you wake up during the night. ? How well you sleep. ? How rested you feel the next day. ? Any side effects of medicines you are taking. ? What you eat and drink.  Make your bedroom a comfortable place where it is easy to fall asleep: ? Put up shades or special blackout curtains to block light from outside. ? Use a white noise machine to block noise. ? Keep the temperature cool.  Exercise regularly as directed by your health care provider. Avoid exercising right before bedtime.  Use relaxation  techniques to manage stress. Ask your health care provider to suggest some techniques that may work well for you. These may include: ? Breathing exercises. ? Routines to release muscle tension. ? Visualizing peaceful scenes.  Cut back on alcohol, caffeinated beverages, and cigarettes, especially close to bedtime. These can disrupt your sleep.  Do not overeat or eat spicy foods right before bedtime. This can lead to digestive discomfort that can make it hard for you to sleep.  Limit screen use before bedtime. This includes: ? Watching TV. ? Using your smartphone, tablet, and computer.  Stick to a routine. This can help you fall asleep faster. Try to do a quiet activity, brush your teeth, and go to bed at the same time each night.  Get out of bed if you are still awake after 15 minutes of trying to sleep. Keep the lights down, but try reading or doing a quiet activity. When you feel sleepy, go back to bed.  Make sure that you drive carefully. Avoid driving if you feel very sleepy.  Keep all follow-up appointments as directed by your health care provider. This is important. Contact a health care provider if:  You are tired throughout the day or have trouble in your daily routine due to  sleepiness.  You continue to have sleep problems or your sleep problems get worse. Get help right away if:  You have serious thoughts about hurting yourself or someone else. This information is not intended to replace advice given to you by your health care provider. Make sure you discuss any questions you have with your health care provider. Document Released: 01/02/2000 Document Revised: 06/06/2015 Document Reviewed: 10/05/2013 Elsevier Interactive Patient Education  2018 [employer]. Cholesterol Cholesterol is a fat. Your body needs a small amount of cholesterol. Cholesterol (plaque) may build up in your blood vessels (arteries). That makes you more likely to have a heart attack or stroke. You cannot  feel your cholesterol level. Having a blood test is the only way to find out if your level is high. Keep your test results. Work with your doctor to keep your cholesterol at a good level. What do the results mean?  Total cholesterol is how much cholesterol is in your blood.  LDL is bad cholesterol. This is the type that can build up. Try to have low LDL.  HDL is good cholesterol. It cleans your blood vessels and carries LDL away. Try to have high HDL.  Triglycerides are fat that the body can store or burn for energy. What are good levels of cholesterol?  Total cholesterol below 200.  LDL below 100 is good for people who have health risks. LDL below 70 is good for people who have very high risks.  HDL above 40 is good. It is best to have HDL of 60 or higher.  Triglycerides below 150. How can I lower my cholesterol? Diet Follow your diet program as told by your doctor.  Choose fish, white meat chicken, or [place] that is roasted or baked. Try not to eat red meat, fried foods, sausage, or lunch meats.  Eat lots of fresh fruits and vegetables.  Choose whole grains, beans, pasta, potatoes, and cereals.  Choose olive oil, corn oil, or canola oil. Only use small amounts.  Try not to eat butter, mayonnaise, shortening, or palm kernel oils.  Try not to eat foods with trans fats.  Choose low-fat or nonfat dairy foods. ? Drink skim or nonfat milk. ? Eat low-fat or nonfat yogurt and cheeses. ? Try not to drink whole milk or cream. ? Try not to eat ice cream, egg yolks, or full-fat cheeses.  Healthy desserts include angel food cake, ginger snaps, animal crackers, hard candy, popsicles, and low-fat or nonfat frozen yogurt. Try not to eat pastries, cakes, pies, and cookies.  Exercise Follow your exercise program as told by your doctor.  Be more active. Try gardening, walking, and taking the stairs.  Ask your doctor about ways that you can be more active.  Medicine  Take  over-the-counter and prescription medicines only as told by your doctor.  This information is not intended to replace advice given to you by your health care provider. Make sure you discuss any questions you have with your health care provider. Document Released: 04/02/2008 Document Revised: 08/06/2015 Document Reviewed: 07/17/2015 Elsevier Interactive Patient Education  2017 [employer].

## 2016-08-13 ENCOUNTER — Other Ambulatory Visit: Payer: Self-pay | Admitting: Medical

## 2016-08-13 DIAGNOSIS — F419 Anxiety disorder, unspecified: Secondary | ICD-10-CM

## 2016-08-15 DIAGNOSIS — F411 Generalized anxiety disorder: Secondary | ICD-10-CM | POA: Insufficient documentation

## 2016-08-16 ENCOUNTER — Other Ambulatory Visit: Payer: Self-pay | Admitting: Medical

## 2016-08-16 DIAGNOSIS — F419 Anxiety disorder, unspecified: Secondary | ICD-10-CM

## 2016-08-16 MED ORDER — SERTRALINE HCL 50 MG PO TABS: 50.0000 mg | ORAL_TABLET | Freq: Every day | ORAL | 1 refills | Status: DC

## 2016-08-25 ENCOUNTER — Ambulatory Visit: Payer: Self-pay | Admitting: Medical

## 2016-08-26 ENCOUNTER — Encounter: Payer: Self-pay | Admitting: Adult Health

## 2016-08-26 ENCOUNTER — Ambulatory Visit: Payer: Self-pay | Admitting: Adult Health

## 2016-08-26 VITALS — BP 116/74 | HR 98 | Temp 98.2°F | Wt 198.2 lb

## 2016-08-26 DIAGNOSIS — J301 Allergic rhinitis due to pollen: Secondary | ICD-10-CM

## 2016-08-26 DIAGNOSIS — E781 Pure hyperglyceridemia: Secondary | ICD-10-CM

## 2016-08-26 DIAGNOSIS — E7800 Pure hypercholesterolemia, unspecified: Secondary | ICD-10-CM

## 2016-08-26 DIAGNOSIS — E559 Vitamin D deficiency, unspecified: Secondary | ICD-10-CM

## 2016-08-26 DIAGNOSIS — J309 Allergic rhinitis, unspecified: Secondary | ICD-10-CM | POA: Insufficient documentation

## 2016-08-26 NOTE — Progress Notes (Addendum)
Subjective:     Patient ID: Antonio Rollins, male   DOB: [birth year], 31 y.o.   MRN: [id number]  HPI  Patient is a 31 year old male in no acute distress who presents to the clinic for follow up.  He has been on Zoloft for 5 to 6 weeks and reports doing well with much improvement. He denies any sucicidal or homicidal ideations.  He reports he has recently discussed his cholesterol levels being high with Allean FoundHeather Ratcliff PA and he would like to try diet management  first, he has decided to cut pork out of his diet and other high cholesterol foods. He will have rechecked in 6 months.  He reports he is doing Yoga four times per week.  He does a lot climbing up steps and walking with his job He reports he walks trails over the weekend for exercise.   Eye doctor he sees [facility] yearly. Never had colonoscopy He reports seeing pulmonary for Asthma management and  Sees Dr. Meredeth IdeFleming regularly. He feels as if his asthma is under control.  He reports he does feel  as if he may have some allergies and feels " as if I am allergic to our cat". He reports runny nose and sniffling.  He desires to see an allergist for allergy testing with his Asthma.    Mom's brothers are  diabetics Maternal Grandfather diabetics Maternal Grandmother -Breast Cancer  Paternal Grandfather - Prostate Cancer  Sister (two)  alive well 2002 Wisdom teeth all extracted. Only surgery reported.  Blood pressure 116/74, pulse 98, temperature 98.2 F (36.8 C), temperature source Tympanic, weight 198 lb 3.2 oz (89.9 kg), SpO2 99 %.  No Known Allergies  Review of Systems  Constitutional: Negative for activity change (doing yoga now), appetite change, chills, diaphoresis, fatigue, fever and unexpected weight change.  HENT: Positive for postnasal drip and rhinorrhea. Negative for congestion (in the morning improves after pulmicort), dental problem, drooling, ear discharge, ear pain, facial swelling, hearing loss, mouth sores,  nosebleeds, sinus pain, sinus pressure, sneezing, sore throat, tinnitus, trouble swallowing and voice change.   Eyes: Negative for photophobia, pain, discharge, redness, itching and visual disturbance.  Respiratory: Negative for apnea, cough (in the morning due to asthma), choking, chest tightness (only with asthma), shortness of breath, wheezing (with heavy activity inhalers relieve) and stridor.   Cardiovascular: Negative for chest pain, palpitations and leg swelling.  Gastrointestinal: Negative for abdominal distention, abdominal pain, anal bleeding, blood in stool, constipation, diarrhea, nausea, rectal pain and vomiting.  Endocrine: Negative for cold intolerance, heat intolerance, polydipsia, polyphagia and polyuria.  Genitourinary: Negative for decreased urine volume, difficulty urinating, discharge, dysuria, enuresis, flank pain, frequency, genital sores, hematuria, penile pain, penile swelling, scrotal swelling, testicular pain (does testicular exams no lumps per patient) and urgency.  Musculoskeletal: Negative for arthralgias, back pain, gait problem, joint swelling, myalgias, neck pain and neck stiffness.  Skin: Negative.  Negative for color change, pallor, rash and wound.  Allergic/Immunologic: Positive for environmental allergies (he wants to be allergy tested would like refferal). Negative for food allergies and immunocompromised state.  Neurological: Negative for dizziness, tremors, seizures, syncope, facial asymmetry, speech difficulty, weakness, light-headedness, numbness and headaches (occasional with long wear headache ).  Hematological: Negative for adenopathy. Does not bruise/bleed easily.  Psychiatric/Behavioral: Negative.  Negative for agitation, behavioral problems, confusion, decreased concentration, dysphoric mood, hallucinations, self-injury, sleep disturbance and suicidal ideas. The patient is not nervous/anxious and is not hyperactive.  Objective:   Physical Exam   Constitutional: Vital signs are normal. He appears well-developed and well-nourished. He is active.  Non-toxic appearance. He does not have a sickly appearance. He does not appear ill. No distress. He is not intubated.  HENT:  Head: Normocephalic and atraumatic.  Right Ear: Hearing, tympanic membrane, external ear and ear canal normal.  Left Ear: Hearing, tympanic membrane, external ear and ear canal normal.  Nose: No mucosal edema or rhinorrhea.  Mouth/Throat: Uvula is midline, oropharynx is clear and moist and mucous membranes are normal. No uvula swelling. No oropharyngeal exudate, posterior oropharyngeal edema, posterior oropharyngeal erythema or tonsillar abscesses.  Eyes: Pupils are equal, round, and reactive to light. Conjunctivae, EOM and lids are normal. Right eye exhibits normal extraocular motion and no nystagmus. Left eye exhibits normal extraocular motion and no nystagmus. Right pupil is round and reactive. Left pupil is round and reactive. Pupils are equal.  Neck: Trachea normal, normal range of motion, full passive range of motion without pain and phonation normal. Neck supple. Normal carotid pulses, no hepatojugular reflux and no JVD present. No tracheal tenderness, no spinous process tenderness and no muscular tenderness present. Carotid bruit is not present. No neck rigidity. No tracheal deviation, no edema, no erythema and normal range of motion present. No Brudzinski's sign and no Kernig's sign noted. No thyroid mass and no thyromegaly present.  Cardiovascular: S1 normal, S2 normal, normal heart sounds and normal pulses.  Exam reveals no gallop, no distant heart sounds and no friction rub.   No murmur heard. Pulses:      Carotid pulses are 2+ on the right side, and 2+ on the left side.      Radial pulses are 2+ on the right side, and 2+ on the left side.       Femoral pulses are 2+ on the right side, and 2+ on the left side.      Popliteal pulses are 2+ on the right side, and 2+  on the left side.       Dorsalis pedis pulses are 2+ on the right side, and 2+ on the left side.       Posterior tibial pulses are 2+ on the right side, and 2+ on the left side.  Pulmonary/Chest: Effort normal and breath sounds normal. No stridor. No apnea, no tachypnea and no bradypnea. He is not intubated.  Abdominal: Soft. Normal appearance and normal aorta. There is no hepatosplenomegaly, splenomegaly or hepatomegaly. There is no tenderness. There is no rigidity, no rebound, no guarding, no CVA tenderness, no tenderness at McBurney's point and negative Murphy's sign. No hernia.  Genitourinary:  Genitourinary Comments: Patient declined and is advised to follow up if any changes or concerns at any time. He verbalized understanding.   Musculoskeletal: Normal range of motion.  Lymphadenopathy:       Head (right side): No submental, no submandibular, no tonsillar, no preauricular, no posterior auricular and no occipital adenopathy present.       Head (left side): No submental, no submandibular, no tonsillar, no preauricular, no posterior auricular and no occipital adenopathy present.    He has no cervical adenopathy.    He has no axillary adenopathy.       Right: No supraclavicular adenopathy present.       Left: No supraclavicular adenopathy present.  Neurological:  Patient moves on and off of exam table and in room without difficulty. Gait is normal in hall and in room. Patient is oriented to person place  time and situation. Patient answers questions appropriately and engages in conversation.   Skin: Skin is warm, dry and intact. No rash noted. He is not diaphoretic. No cyanosis. Nails show no clubbing.  Patient declined undressing fully so limited to arms and legs.   Psychiatric: He has a normal mood and affect. His speech is normal and behavior is normal. Judgment and thought content normal. Cognition and memory are normal.     Assessment:  Vitamin D deficiency - Plan: VITAMIN D 25  Hydroxy (Vit-D Deficiency, Fractures)  Non-seasonal allergic rhinitis due to pollen - Plan: Ambulatory referral to Allergy  High cholesterol  High triglycerides   Plan:     1. Continue Lexapro as prescribed/ call if any changes in mood or symptoms. Seek 911 for any suicidal/ homicidal ideations or behavior changes. Seek Emergency room for any needs after hours.      2. Would like referral to allergist for allergy testing due to Asthma history and allergy symptoms.        3. Will start Vitamin D3 2000 IU daily and repeat lab in three months. Standing order placed for recheck in 3 months.    4.  He has discussed his cholesterol with Allean Found PA and as well in clinic today. He does not want to be on a statin at this time or other cholesterol lowering drug.Risks versus benefits discussed.  He is making changes to his diet and will have cholesterol rechecked in 6 months. He will call back in for an appointment if he would like to add medication.      Return to clinic at any time  if any new symptoms change, worsen develop or do not improve. Patient verbalized above understanding of all instructions and denies any questions currently.

## 2016-08-27 DIAGNOSIS — E782 Mixed hyperlipidemia: Secondary | ICD-10-CM | POA: Insufficient documentation

## 2016-08-27 DIAGNOSIS — E781 Pure hyperglyceridemia: Secondary | ICD-10-CM | POA: Insufficient documentation

## 2016-08-27 NOTE — Patient Instructions (Addendum)
Living With Anxiety After being diagnosed with an anxiety disorder, you may be relieved to know why you have felt or behaved a certain way. It is natural to also feel overwhelmed about the treatment ahead and what it will mean for your life. With care and support, you can manage this condition and recover from it. How to cope with anxiety Dealing with stress Stress is your body's reaction to life changes and events, both good and bad. Stress can last just a few hours or it can be ongoing. Stress can play a major role in anxiety, so it is important to learn both how to cope with stress and how to think about it differently. Talk with your health care provider or a counselor to learn more about stress reduction. He or she may suggest some stress reduction techniques, such as:  Music therapy. This can include creating or listening to music that you enjoy and that inspires you.  Mindfulness-based meditation. This involves being aware of your normal breaths, rather than trying to control your breathing. It can be done while sitting or walking.  Centering prayer. This is a kind of meditation that involves focusing on a word, phrase, or sacred image that is meaningful to you and that brings you peace.  Deep breathing. To do this, expand your stomach and inhale slowly through your nose. Hold your breath for 3-5 seconds. Then exhale slowly, allowing your stomach muscles to relax.  Self-talk. This is a skill where you identify thought patterns that lead to anxiety reactions and correct those thoughts.  Muscle relaxation. This involves tensing muscles then relaxing them.  Choose a stress reduction technique that fits your lifestyle and personality. Stress reduction techniques take time and practice. Set aside 5-15 minutes a day to do them. Therapists can offer training in these techniques. The training may be covered by some insurance plans. Other things you can do to manage stress include:  Keeping a  stress diary. This can help you learn what triggers your stress and ways to control your response.  Thinking about how you respond to certain situations. You may not be able to control everything, but you can control your reaction.  Making time for activities that help you relax, and not feeling guilty about spending your time in this way.  Therapy combined with coping and stress-reduction skills provides the best chance for successful treatment. Medicines Medicines can help ease symptoms. Medicines for anxiety include:  Anti-anxiety drugs.  Antidepressants.  Beta-blockers.  Medicines may be used as the main treatment for anxiety disorder, along with therapy, or if other treatments are not working. Medicines should be prescribed by a health care provider. Relationships Relationships can play a big part in helping you recover. Try to spend more time connecting with trusted friends and family members. Consider going to couples counseling, taking family education classes, or going to family therapy. Therapy can help you and others better understand the condition. How to recognize changes in your condition Everyone has a different response to treatment for anxiety. Recovery from anxiety happens when symptoms decrease and stop interfering with your daily activities at home or work. This may mean that you will start to:  Have better concentration and focus.  Sleep better.  Be less irritable.  Have more energy.  Have improved memory.  It is important to recognize when your condition is getting worse. Contact your health care provider if your symptoms interfere with home or work and you do not feel like your condition   is improving. Where to find help and support: You can get help and support from these sources:  Self-help groups.  Online and [employer].  A trusted spiritual leader.  Couples counseling.  Family education classes.  Family therapy.  Follow these  instructions at home:  Eat a healthy diet that includes plenty of vegetables, fruits, whole grains, low-fat dairy products, and lean protein. Do not eat a lot of foods that are high in solid fats, added sugars, or salt.  Exercise. Most adults should do the following: ? Exercise for at least 150 minutes each week. The exercise should increase your heart rate and make you sweat (moderate-intensity exercise). ? Strengthening exercises at least twice a week.  Cut down on caffeine, tobacco, alcohol, and other potentially harmful substances.  Get the right amount and quality of sleep. Most adults need 7-9 hours of sleep each night.  Make choices that simplify your life.  Take over-the-counter and prescription medicines only as told by your health care provider.  Avoid caffeine, alcohol, and certain over-the-counter cold medicines. These may make you feel worse. Ask your pharmacist which medicines to avoid.  Keep all follow-up visits as told by your health care provider. This is important. Questions to ask your health care provider  Would I benefit from therapy?  How often should I follow up with a health care provider?  How long do I need to take medicine?  Are there any long-term side effects of my medicine?  Are there any alternatives to taking medicine? Contact a health care provider if:  You have a hard time staying focused or finishing daily tasks.  You spend many hours a day feeling worried about everyday life.  You become exhausted by worry.  You start to have headaches, feel tense, or have nausea.  You urinate more than normal.  You have diarrhea. Get help right away if:  You have a racing heart and shortness of breath.  You have thoughts of hurting yourself or others. If you ever feel like you may hurt yourself or others, or have thoughts about taking your own life, get help right away. You can go to your nearest emergency department or call:  Your local emergency  services (911 in the [place]  A suicide crisis helpline, such as the [facility] at 760-459-2243. This is open 24-hours a day.  Summary  Taking steps to deal with stress can help calm you.  Medicines cannot cure anxiety disorders, but they can help ease symptoms.  Family, friends, and partners can play a big part in helping you recover from an anxiety disorder. This information is not intended to replace advice given to you by your health care provider. Make sure you discuss any questions you have with your health care provider. Document Released: 12/30/2015 Document Revised: 12/30/2015 Document Reviewed: 12/30/2015 Elsevier Interactive Patient Education  2018 [employer]. Escitalopram tablets What is this medicine? ESCITALOPRAM (es sye TAL oh pram) is used to treat depression and certain types of anxiety. This medicine may be used for other purposes; ask your health care provider or pharmacist if you have questions. COMMON BRAND NAME(S): Lexapro What should I tell my health care provider before I take this medicine? They need to know if you have any of these conditions: -bipolar disorder or a family history of bipolar disorder -diabetes -glaucoma -heart disease -kidney or liver disease -receiving electroconvulsive therapy -seizures (convulsions) -suicidal thoughts, plans, or attempt by you or a family member -an unusual or allergic  reaction to escitalopram, the related drug citalopram, other medicines, foods, dyes, or preservatives -pregnant or trying to become pregnant -breast-feeding How should I use this medicine? Take this medicine by mouth with a glass of water. Follow the directions on the prescription label. You can take it with or without food. If it upsets your stomach, take it with food. Take your medicine at regular intervals. Do not take it more often than directed. Do not stop taking this medicine suddenly except upon the advice of your  doctor. Stopping this medicine too quickly may cause serious side effects or your condition may worsen. A special MedGuide will be given to you by the pharmacist with each prescription and refill. Be sure to read this information carefully each time. Talk to your pediatrician regarding the use of this medicine in children. Special care may be needed. Overdosage: If you think you have taken too much of this medicine contact a poison control center or emergency room at once. NOTE: This medicine is only for you. Do not share this medicine with others. What if I miss a dose? If you miss a dose, take it as soon as you can. If it is almost time for your next dose, take only that dose. Do not take double or extra doses. What may interact with this medicine? Do not take this medicine with any of the following medications: -certain medicines for fungal infections like fluconazole, itraconazole, ketoconazole, posaconazole, voriconazole -cisapride -citalopram -dofetilide -dronedarone -linezolid -MAOIs like Carbex, Eldepryl, Marplan, Nardil, and Parnate -methylene blue (injected into a vein) -pimozide -thioridazine -ziprasidone This medicine may also interact with the following medications: -alcohol -amphetamines -aspirin and aspirin-like medicines -carbamazepine -certain medicines for depression, anxiety, or psychotic disturbances -certain medicines for migraine headache like almotriptan, eletriptan, frovatriptan, naratriptan, rizatriptan, sumatriptan, zolmitriptan -certain medicines for sleep -certain medicines that treat or prevent blood clots like warfarin, enoxaparin, dalteparin -cimetidine -diuretics -fentanyl -furazolidone -isoniazid -lithium -metoprolol -NSAIDs, medicines for pain and inflammation, like ibuprofen or naproxen -other medicines that prolong the QT interval (cause an abnormal heart rhythm) -procarbazine -rasagiline -supplements like St. John's wort, kava kava,  valerian -tramadol -tryptophan This list may not describe all possible interactions. Give your health care provider a list of all the medicines, herbs, non-prescription drugs, or dietary supplements you use. Also tell them if you smoke, drink alcohol, or use illegal drugs. Some items may interact with your medicine. What should I watch for while using this medicine? Tell your doctor if your symptoms do not get better or if they get worse. Visit your doctor or health care professional for regular checks on your progress. Because it may take several weeks to see the full effects of this medicine, it is important to continue your treatment as prescribed by your doctor. Patients and their families should watch out for new or worsening thoughts of suicide or depression. Also watch out for sudden changes in feelings such as feeling anxious, agitated, panicky, irritable, hostile, aggressive, impulsive, severely restless, overly excited and hyperactive, or not being able to sleep. If this happens, especially at the beginning of treatment or after a change in dose, call your health care professional. Dennis Bast may get drowsy or dizzy. Do not drive, use machinery, or do anything that needs mental alertness until you know how this medicine affects you. Do not stand or sit up quickly, especially if you are an older patient. This reduces the risk of dizzy or fainting spells. Alcohol may interfere with the effect of this medicine. Avoid alcoholic  drinks. Your mouth may get dry. Chewing sugarless gum or sucking hard candy, and drinking plenty of water may help. Contact your doctor if the problem does not go away or is severe. What side effects may I notice from receiving this medicine? Side effects that you should report to your doctor or health care professional as soon as possible: -allergic reactions like skin rash, itching or hives, swelling of the face, lips, or tongue -anxious -black, tarry stools -changes in  vision -confusion -elevated mood, decreased need for sleep, racing thoughts, impulsive behavior -eye pain -fast, irregular heartbeat -feeling faint or lightheaded, falls -feeling agitated, angry, or irritable -hallucination, loss of contact with reality -loss of balance or coordination -loss of memory -painful or prolonged erections -restlessness, pacing, inability to keep still -seizures -stiff muscles -suicidal thoughts or other mood changes -trouble sleeping -unusual bleeding or bruising -unusually weak or tired -vomiting Side effects that usually do not require medical attention (report to your doctor or health care professional if they continue or are bothersome): -changes in appetite -change in sex drive or performance -headache -increased sweating -indigestion, nausea -tremors This list may not describe all possible side effects. Call your doctor for medical advice about side effects. You may report side effects to FDA at 1-800-FDA-1088. Where should I keep my medicine? Keep out of reach of children. Store at room temperature between 15 and 30 degrees C (59 and 86 degrees F). Throw away any unused medicine after the expiration date. NOTE: This sheet is a summary. It may not cover all possible information. If you have questions about this medicine, talk to your doctor, pharmacist, or health care provider.  2018 Elsevier/Gold Standard (2015-06-09 13:20:23) Vitamin D Deficiency Vitamin D deficiency is when your body does not have enough vitamin D. Vitamin D is important to your body for many reasons:  It helps the body to absorb two important minerals, called calcium and phosphorus.  It plays a role in bone health.  It may help to prevent some diseases, such as diabetes and multiple sclerosis.  It plays a role in muscle function, including heart function.  You can get vitamin D by:  Eating foods that naturally contain vitamin D.  Eating or drinking milk or other  dairy products that have vitamin D added to them.  Taking a vitamin D supplement or a multivitamin supplement that contains vitamin D.  Being in the sun. Your body naturally makes vitamin D when your skin is exposed to sunlight. Your body changes the sunlight into a form of the vitamin that the body can use.  If vitamin D deficiency is severe, it can cause a condition in which your bones become soft. In adults, this condition is called osteomalacia. In children, this condition is called rickets. What are the causes? Vitamin D deficiency may be caused by:  Not eating enough foods that contain vitamin D.  Not getting enough sun exposure.  Having certain digestive system diseases that make it difficult for your body to absorb vitamin D. These diseases include Crohn disease, chronic pancreatitis, and cystic fibrosis.  Having a surgery in which a part of the stomach or a part of the small intestine is removed.  Being obese.  Having chronic kidney disease or liver disease.  What increases the risk? This condition is more likely to develop in:  Older people.  People who do not spend much time outdoors.  People who live in a long-term care facility.  People who have had broken bones.  People with weak or thin bones (osteoporosis).  People who have a disease or condition that changes how the body absorbs vitamin D.  People who have dark skin.  People who take certain medicines, such as steroid medicines or certain seizure medicines.  People who are overweight or obese.  What are the signs or symptoms? In mild cases of vitamin D deficiency, there may not be any symptoms. If the condition is severe, symptoms may include:  Bone pain.  Muscle pain.  Falling often.  Broken bones caused by a minor injury.  How is this diagnosed? This condition is usually diagnosed with a blood test. How is this treated? Treatment for this condition may depend on what caused the condition.  Treatment options include:  Taking vitamin D supplements.  Taking a calcium supplement. Your health care provider will suggest what dose is best for you.  Follow these instructions at home:  Take medicines and supplements only as told by your health care provider.  Eat foods that contain vitamin D. Choices include: ? Fortified dairy products, cereals, or juices. Fortified means that vitamin D has been added to the food. Check the label on the package to be sure. ? Fatty fish, such as salmon or trout. ? Eggs. ? Oysters.  Do not use a tanning bed.  Maintain a healthy weight. Lose weight, if needed.  Keep all follow-up visits as told by your health care provider. This is important. Contact a health care provider if:  Your symptoms do not go away.  You feel like throwing up (nausea) or you throw up (vomit).  You have fewer bowel movements than usual or it is difficult for you to have a bowel movement (constipation). This information is not intended to replace advice given to you by your health care provider. Make sure you discuss any questions you have with your health care provider. Document Released: 03/29/2011 Document Revised: 06/18/2015 Document Reviewed: 05/22/2014 Elsevier Interactive Patient Education  2018 [employer]. Cholesterol Cholesterol is a white, waxy, fat-like substance that is needed by the human body in small amounts. The liver makes all the cholesterol we need. Cholesterol is carried from the liver by the blood through the blood vessels. Deposits of cholesterol (plaques) may build up on blood vessel (artery) walls. Plaques make the arteries narrower and stiffer. Cholesterol plaques increase the risk for heart attack and stroke. You cannot feel your cholesterol level even if it is very high. The only way to know that it is high is to have a blood test. Once you know your cholesterol levels, you should keep a record of the test results. Work with your health care  provider to keep your levels in the desired range. What do the results mean?  Total cholesterol is a rough measure of all the cholesterol in your blood.  LDL (low-density lipoprotein) is the "bad" cholesterol. This is the type that causes plaque to build up on the artery walls. You want this level to be low.  HDL (high-density lipoprotein) is the "good" cholesterol because it cleans the arteries and carries the LDL away. You want this level to be high.  Triglycerides are fat that the body can either burn for energy or store. High levels are closely linked to heart disease. What are the desired levels of cholesterol?  Total cholesterol below 200.  LDL below 100 for people who are at risk, below 70 for people at very high risk.  HDL above 40 is good. A level of 60 or  higher is considered to be protective against heart disease.  Triglycerides below 150. How can I lower my cholesterol? Diet Follow your diet program as told by your health care provider.  Choose fish or white meat chicken and [place], roasted or baked. Limit fatty cuts of red meat, fried foods, and processed meats, such as sausage and lunch meats.  Eat lots of fresh fruits and vegetables.  Choose whole grains, beans, pasta, potatoes, and cereals.  Choose olive oil, corn oil, or canola oil, and use only small amounts.  Avoid butter, mayonnaise, shortening, or palm kernel oils.  Avoid foods with trans fats.  Drink skim or nonfat milk and eat low-fat or nonfat yogurt and cheeses. Avoid whole milk, cream, ice cream, egg yolks, and full-fat cheeses.  Healthier desserts include angel food cake, ginger snaps, animal crackers, hard candy, popsicles, and low-fat or nonfat frozen yogurt. Avoid pastries, cakes, pies, and cookies.  Exercise  Follow your exercise program as told by your health care provider. A regular program: ? Helps to decrease LDL and raise HDL. ? Helps with weight control.  Do things that increase your  activity level, such as gardening, walking, and taking the stairs.  Ask your health care provider about ways that you can be more active in your daily life.  Medicine  Take over-the-counter and prescription medicines only as told by your health care provider. ? Medicine may be prescribed by your health care provider to help lower cholesterol and decrease the risk for heart disease. This is usually done if diet and exercise have failed to bring down cholesterol levels. ? If you have several risk factors, you may need medicine even if your levels are normal.  This information is not intended to replace advice given to you by your health care provider. Make sure you discuss any questions you have with your health care provider. Document Released: 09/29/2000 Document Revised: 08/02/2015 Document Reviewed: 07/05/2015 Elsevier Interactive Patient Education  2017 [employer] Allergy Skin Testing Why am I having this test? Allergy skin testing is done to check whether you have an allergy to something. Testing may be done in one of two ways:  Injecting a small amount of the substance you may be allergic to (allergen).  Applying patches to your skin.  Your health care provider will determine the results of your test by checking for an allergic reaction on the skin where the allergen was injected or where the patches were applied. How do I collect samples at home? If you will receive an injection, you will not need to do anything at home. If patches will be applied to your skin, you will need to:  Wear them for 48 hours.  Return to your health care provider's office to have them removed. Do not remove them yourself.  Avoid bathing and activities that cause heavy sweating until after the patches are removed.  How do I prepare for this test?  Let your health care provider know about all medicines you are taking, including vitamins, herbs, eye drops, creams, and over-the-counter medicines. Some  medicines can affect test results. Your health care provider will let you know when to stop taking those medicines and when you can begin taking them again.  If you are having a patch test: ? Do not apply ointments, creams, or lotion to the skin where the patch will be placed. Usually, the patches are placed on your forearm or on your back. ? Bring any items that you think you are allergic  to, such as cosmetics, soaps, and perfume. What are the reference ranges? Reference ranges are considered healthy ranges established after testing a large group of healthy people. Reference ranges may vary among different people, labs, and hospitals. The reference range for allergy skin testing is a swollen area of skin (wheal) less than 3mm in diameter, with surrounding redness and swelling (flare) less than 10mm in diameter. What do the results mean?  A result within the reference range means you are probably not allergic to the allergen.  A result in which the wheal is 3mm or more and the flare is 10mm or more means you are likely allergic to the allergen. Your health care provider will consider the results of your test in addition to your symptoms before diagnosing you with an allergy. Talk with your health care provider to discuss your results, treatment options, and if necessary, the need for more tests. Talk with your health care provider if you have any questions about your results. This information is not intended to replace advice given to you by your health care provider. Make sure you discuss any questions you have with your health care provider. Document Released: 01/28/2004 Document Revised: 06/12/2015 Document Reviewed: 10/16/2013 Elsevier Interactive Patient Education  2018 [employer]. Allergic Rhinitis Allergic rhinitis is when the mucous membranes in the nose respond to allergens. Allergens are particles in the air that cause your body to have an allergic reaction. This causes you to release  allergic antibodies. Through a chain of events, these eventually cause you to release histamine into the blood stream. Although meant to protect the body, it is this release of histamine that causes your discomfort, such as frequent sneezing, congestion, and an itchy, runny nose. What are the causes? Seasonal allergic rhinitis (hay fever) is caused by pollen allergens that may come from grasses, trees, and weeds. Year-round allergic rhinitis (perennial allergic rhinitis) is caused by allergens such as house dust mites, pet dander, and mold spores. What are the signs or symptoms?  Nasal stuffiness (congestion).  Itchy, runny nose with sneezing and tearing of the eyes. How is this diagnosed? Your health care provider can help you determine the allergen or allergens that trigger your symptoms. If you and your health care provider are unable to determine the allergen, skin or blood testing may be used. Your health care provider will diagnose your condition after taking your health history and performing a physical exam. Your health care provider may assess you for other related conditions, such as asthma, pink eye, or an ear infection. How is this treated? Allergic rhinitis does not have a cure, but it can be controlled by:  Medicines that block allergy symptoms. These may include allergy shots, nasal sprays, and oral antihistamines.  Avoiding the allergen.  Hay fever may often be treated with antihistamines in pill or nasal spray forms. Antihistamines block the effects of histamine. There are over-the-counter medicines that may help with nasal congestion and swelling around the eyes. Check with your health care provider before taking or giving this medicine. If avoiding the allergen or the medicine prescribed do not work, there are many new medicines your health care provider can prescribe. Stronger medicine may be used if initial measures are ineffective. Desensitizing injections can be used if  medicine and avoidance does not work. Desensitization is when a patient is given ongoing shots until the body becomes less sensitive to the allergen. Make sure you follow up with your health care provider if problems continue. Follow these  instructions at home: It is not possible to completely avoid allergens, but you can reduce your symptoms by taking steps to limit your exposure to them. It helps to know exactly what you are allergic to so that you can avoid your specific triggers. Contact a health care provider if:  You have a fever.  You develop a cough that does not stop easily (persistent).  You have shortness of breath.  You start wheezing.  Symptoms interfere with normal daily activities. This information is not intended to replace advice given to you by your health care provider. Make sure you discuss any questions you have with your health care provider. Document Released: 09/29/2000 Document Revised: 09/05/2015 Document Reviewed: 09/11/2012 Elsevier Interactive Patient Education  2017 [employer] Asthma, Adult Asthma is a condition of the lungs in which the airways tighten and narrow. Asthma can make it hard to breathe. Asthma cannot be cured, but medicine and lifestyle changes can help control it. Asthma may be started (triggered) by:  Animal skin flakes (dander).  Dust.  Cockroaches.  Pollen.  Mold.  Smoke.  Cleaning products.  Hair sprays or aerosol sprays.  Paint fumes or strong smells.  Cold air, weather changes, and winds.  Crying or laughing hard.  Stress.  Certain medicines or drugs.  Foods, such as dried fruit, potato chips, and sparkling grape juice.  Infections or conditions (colds, flu).  Exercise.  Certain medical conditions or diseases.  Exercise or tiring activities.  Follow these instructions at home:  Take medicine as told by your doctor.  Use a peak flow meter as told by your doctor. A peak flow meter is a tool that measures  how well the lungs are working.  Record and keep track of the peak flow meter's readings.  Understand and use the asthma action plan. An asthma action plan is a written plan for taking care of your asthma and treating your attacks.  To help prevent asthma attacks: ? Do not smoke. Stay away from secondhand smoke. ? Change your heating and air conditioning filter often. ? Limit your use of fireplaces and wood stoves. ? Get rid of pests (such as roaches and mice) and their droppings. ? Throw away plants if you see mold on them. ? Clean your floors. Dust regularly. Use cleaning products that do not smell. ? Have someone vacuum when you are not home. Use a vacuum cleaner with a HEPA filter if possible. ? Replace carpet with wood, tile, or vinyl flooring. Carpet can trap animal skin flakes and dust. ? Use allergy-proof pillows, mattress covers, and box spring covers. ? Wash bed sheets and blankets every week in hot water and dry them in a dryer. ? Use blankets that are made of polyester or cotton. ? Clean bathrooms and kitchens with bleach. If possible, have someone repaint the walls in these rooms with mold-resistant paint. Keep out of the rooms that are being cleaned and painted. ? Wash hands often. Contact a doctor if:  You have make a whistling sound when breaking (wheeze), have shortness of breath, or have a cough even if taking medicine to prevent attacks.  The colored mucus you cough up (sputum) is thicker than usual.  The colored mucus you cough up changes from clear or white to yellow, green, gray, or bloody.  You have problems from the medicine you are taking such as: ? A rash. ? Itching. ? Swelling. ? Trouble breathing.  You need reliever medicines more than 2-3 times a week.  Your  peak flow measurement is still at 50-79% of your personal best after following the action plan for 1 hour.  You have a fever. Get help right away if:  You seem to be worse and are not  responding to medicine during an asthma attack.  You are short of breath even at rest.  You get short of breath when doing very little activity.  You have trouble eating, drinking, or talking.  You have chest pain.  You have a fast heartbeat.  Your lips or fingernails start to turn blue.  You are light-headed, dizzy, or faint.  Your peak flow is less than 50% of your personal best. This information is not intended to replace advice given to you by your health care provider. Make sure you discuss any questions you have with your health care provider. Document Released: 06/23/2007 Document Revised: 06/12/2015 Document Reviewed: 08/03/2012 Elsevier Interactive Patient Education  2017 [employer] Inc. Cholesterol Cholesterol is a white, waxy, fat-like substance that is needed by the human body in small amounts. The liver makes all the cholesterol we need. Cholesterol is carried from the liver by the blood through the blood vessels. Deposits of cholesterol (plaques) may build up on blood vessel (artery) walls. Plaques make the arteries narrower and stiffer. Cholesterol plaques increase the risk for heart attack and stroke. You cannot feel your cholesterol level even if it is very high. The only way to know that it is high is to have a blood test. Once you know your cholesterol levels, you should keep a record of the test results. Work with your health care provider to keep your levels in the desired range. What do the results mean?  Total cholesterol is a rough measure of all the cholesterol in your blood.  LDL (low-density lipoprotein) is the "bad" cholesterol. This is the type that causes plaque to build up on the artery walls. You want this level to be low.  HDL (high-density lipoprotein) is the "good" cholesterol because it cleans the arteries and carries the LDL away. You want this level to be high.  Triglycerides are fat that the body can either burn for energy or store. High levels are  closely linked to heart disease. What are the desired levels of cholesterol?  Total cholesterol below 200.  LDL below 100 for people who are at risk, below 70 for people at very high risk.  HDL above 40 is good. A level of 60 or higher is considered to be protective against heart disease.  Triglycerides below 150. How can I lower my cholesterol? Diet Follow your diet program as told by your health care provider.  Choose fish or white meat chicken and [place], roasted or baked. Limit fatty cuts of red meat, fried foods, and processed meats, such as sausage and lunch meats.  Eat lots of fresh fruits and vegetables.  Choose whole grains, beans, pasta, potatoes, and cereals.  Choose olive oil, corn oil, or canola oil, and use only small amounts.  Avoid butter, mayonnaise, shortening, or palm kernel oils.  Avoid foods with trans fats.  Drink skim or nonfat milk and eat low-fat or nonfat yogurt and cheeses. Avoid whole milk, cream, ice cream, egg yolks, and full-fat cheeses.  Healthier desserts include angel food cake, ginger snaps, animal crackers, hard candy, popsicles, and low-fat or nonfat frozen yogurt. Avoid pastries, cakes, pies, and cookies.  Exercise  Follow your exercise program as told by your health care provider. A regular program: ? Helps to decrease LDL and  raise HDL. ? Helps with weight control.  Do things that increase your activity level, such as gardening, walking, and taking the stairs.  Ask your health care provider about ways that you can be more active in your daily life.  Medicine  Take over-the-counter and prescription medicines only as told by your health care provider. ? Medicine may be prescribed by your health care provider to help lower cholesterol and decrease the risk for heart disease. This is usually done if diet and exercise have failed to bring down cholesterol levels. ? If you have several risk factors, you may need medicine even if your  levels are normal.  This information is not intended to replace advice given to you by your health care provider. Make sure you discuss any questions you have with your health care provider. Document Released: 09/29/2000 Document Revised: 08/02/2015 Document Reviewed: 07/05/2015 Elsevier Interactive Patient Education  2017 [employer]. Food Choices to Lower Your Triglycerides Triglycerides are a type of fat in your blood. High levels of triglycerides can increase the risk of heart disease and stroke. If your triglyceride levels are high, the foods you eat and your eating habits are very important. Choosing the right foods can help lower your triglycerides. What general guidelines do I need to follow?  Lose weight if you are overweight.  Limit or avoid alcohol.  Fill one half of your plate with vegetables and green salads.  Limit fruit to two servings a day. Choose fruit instead of juice.  Make one fourth of your plate whole grains. Look for the word "whole" as the first word in the ingredient list.  Fill one fourth of your plate with lean protein foods.  Enjoy fatty fish (such as salmon, mackerel, sardines, and tuna) three times a week.  Choose healthy fats.  Limit foods high in starch and sugar.  Eat more home-cooked food and less restaurant, buffet, and fast food.  Limit fried foods.  Cook foods using methods other than frying.  Limit saturated fats.  Check ingredient lists to avoid foods with partially hydrogenated oils (trans fats) in them. What foods can I eat? Grains Whole grains, such as whole wheat or whole grain breads, crackers, cereals, and pasta. Unsweetened oatmeal, bulgur, barley, quinoa, or brown rice. Corn or whole wheat flour tortillas. Vegetables Fresh or frozen vegetables (raw, steamed, roasted, or grilled). Green salads. Fruits All fresh, canned (in natural juice), or frozen fruits. Meat and Other Protein Products Ground beef (85% or leaner),  grass-fed beef, or beef trimmed of fat. Skinless chicken or [place]. Ground chicken or [place]. Pork trimmed of fat. All fish and seafood. Eggs. Dried beans, peas, or lentils. Unsalted nuts or seeds. Unsalted canned or dry beans. Dairy Low-fat dairy products, such as skim or 1% milk, 2% or reduced-fat cheeses, low-fat ricotta or cottage cheese, or plain low-fat yogurt. Fats and Oils Tub margarines without trans fats. Light or reduced-fat mayonnaise and salad dressings. Avocado. Safflower, olive, or canola oils. Natural peanut or almond butter. The items listed above may not be a complete list of recommended foods or beverages. Contact your dietitian for more options. What foods are not recommended? Grains White bread. White pasta. White rice. Cornbread. Bagels, pastries, and croissants. Crackers that contain trans fat. Vegetables White potatoes. Corn. Creamed or fried vegetables. Vegetables in a cheese sauce. Fruits Dried fruits. Canned fruit in light or heavy syrup. Fruit juice. Meat and Other Protein Products Fatty cuts of meat. Ribs, chicken wings, bacon, sausage, bologna, salami, chitterlings, fatback,  hot dogs, bratwurst, and packaged luncheon meats. Dairy Whole or 2% milk, cream, half-and-half, and cream cheese. Whole-fat or sweetened yogurt. Full-fat cheeses. Nondairy creamers and whipped toppings. Processed cheese, cheese spreads, or cheese curds. Sweets and Desserts Corn syrup, sugars, honey, and molasses. Candy. Jam and jelly. Syrup. Sweetened cereals. Cookies, pies, cakes, donuts, muffins, and ice cream. Fats and Oils Butter, stick margarine, lard, shortening, ghee, or bacon fat. Coconut, palm kernel, or palm oils. Beverages Alcohol. Sweetened drinks (such as sodas, lemonade, and fruit drinks or punches). The items listed above may not be a complete list of foods and beverages to avoid. Contact your dietitian for more information. This information is not intended to replace advice  given to you by your health care provider. Make sure you discuss any questions you have with your health care provider. Document Released: 10/23/2003 Document Revised: 06/12/2015 Document Reviewed: 11/08/2012 Elsevier Interactive Patient Education  2017 [employer].

## 2016-09-17 ENCOUNTER — Ambulatory Visit: Payer: Self-pay | Admitting: Adult Health

## 2016-09-17 VITALS — BP 120/80 | HR 84 | Temp 97.6°F | Resp 16

## 2016-09-17 DIAGNOSIS — L989 Disorder of the skin and subcutaneous tissue, unspecified: Secondary | ICD-10-CM

## 2016-09-17 DIAGNOSIS — H6090 Unspecified otitis externa, unspecified ear: Secondary | ICD-10-CM

## 2016-09-17 MED ORDER — AMOXICILLIN-POT CLAVULANATE 875-125 MG PO TABS: 1.0000 | ORAL_TABLET | Freq: Two times a day (BID) | ORAL | 0 refills | Status: DC

## 2016-09-17 NOTE — Patient Instructions (Signed)
Otitis Externa Otitis externa is an infection of the outer ear canal. The outer ear canal is the area between the outside of the ear and the eardrum. Otitis externa is sometimes called "swimmer's ear." Follow these instructions at home:  If you were given antibiotic ear drops, use them as told by your doctor. Do not stop using them even if your condition gets better.  Take over-the-counter and prescription medicines only as told by your doctor.  Keep all follow-up visits as told by your doctor. This is important. How is this prevented?  Keep your ear dry. Use the corner of a towel to dry your ear after you swim or bathe.  Try not to scratch or put things in your ear. Doing these things makes it easier for germs to grow in your ear.  Avoid swimming in lakes, dirty water, or pools that may not have the right amount of a chemical called chlorine.  Consider making ear drops and putting 3 or 4 drops in each ear after you swim. Ask your doctor about how you can make ear drops. Contact a doctor if:  You have a fever.  After 3 days your ear is still red, swollen, or painful.  After 3 days you still have pus coming from your ear.  Your redness, swelling, or pain gets worse.  You have a really bad headache.  You have redness, swelling, pain, or tenderness behind your ear. This information is not intended to replace advice given to you by your health care provider. Make sure you discuss any questions you have with your health care provider. Document Released: 06/23/2007 Document Revised: 01/30/2015 Document Reviewed: 10/14/2014 Elsevier Interactive Patient Education  2018 [employer]. Mole A mole is a colored (pigmented) growth on the skin. Moles are very common. They are usually harmless, but some moles can become cancerous over time. What are the causes? Moles occur when pigmented skin cells grow together in clusters instead of spreading out in the skin as they normally do. The reason  why the skin cells grow together in clusters is not known. What are the signs or symptoms? A mole may be:  Manson Passey or black.  Flat or raised.  Smooth or wrinkled.  How is this diagnosed? A mole is diagnosed with a skin exam. If your health care provider thinks a mole may be cancerous, a piece of the mole will be removed for testing. How is this treated? Treatment is not needed unless a mole is cancerous. If a mole is cancerous, it will be removed. If a mole is causing pain or you do not like the way it looks, you may choose to have it removed. Follow these instructions at home:  Every month, look for new moles and check your existing moles for changes. This is important because a change in a mole can mean that the mole has become cancerous. Look for changes in: ? Size. Look for moles that are more than  in (0.64 cm) wide (in diameter). ? Shape. Look for moles that are not round or oval. ? Borders. Look for moles that are not symmetrical. ? Color. Note that it is normal for moles to get darker during pregnancy or when you take birth control pills.  When you are outdoors, wear sunscreen with SPF 30 (sun protection factor 30) or higher. Reapply the sunscreen every 2-3 hours.  If you have a large number of moles, see a skin doctor (dermatologist) at least one time every year. Contact a health  care provider if:  The size, shape, borders, or color of your mole change.  Your mole, or the skin near the mole, becomes painful, sore, red, or swollen.  Your mole: ? Develops more than one color. ? Itches or bleeds. ? Becomes scaly, sheds skin, or oozes fluid. ? Becomes flat or develops raised areas. ? Becomes hard or soft.  You develop a new mole. This information is not intended to replace advice given to you by your health care provider. Make sure you discuss any questions you have with your health care provider. Document Released: 09/29/2000 Document Revised: 06/18/2015 Document  Reviewed: 10/25/2014 Elsevier Interactive Patient Education  [employer]2018 Elsevier Inc.

## 2016-09-17 NOTE — Progress Notes (Addendum)
Subjective:     Patient ID: Antonio Rollins, male   DOB: 05-19-1985, 31 y.o.   MRN: [id number]  HPI   Patient is a 31 year old male in no acute distress who presents to the clinic for place under his left arm pit that he has never noticed before. He does report he first noticed it because it was itching. He denies any drainage, lumps, warmth or pain presently.   He also reports nasal congestion started today. Kids have cough at home. Denies any fevers or any other symptoms. Ear pressure.  No Known Allergies  He took Mucinex this am  which helped.   He reports he is doing well on his Zoloft and feels much better without any anxiety at present. He denies any behavior changes. He denies any suicidal or homicidal ideations.  Current Outpatient Prescriptions:  .  budesonide (PULMICORT) 0.5 MG/2ML nebulizer solution, Inhale 0.5 mg into the lungs as needed., Disp: , Rfl: 11 .  PROAIR HFA 108 (90 Base) MCG/ACT inhaler, Inhale 0.5 mg into the lungs as needed., Disp: , Rfl: 3 .  sertraline (ZOLOFT) 50 MG tablet, Take 1 tablet (50 mg total) by mouth daily., Disp: 30 tablet, Rfl: 1   Review of Systems  Constitutional: Negative.   HENT: Positive for congestion and sinus pressure. Negative for sinus pain.   Eyes: Negative.   Respiratory: Negative.   Cardiovascular: Negative.   Gastrointestinal: Negative.   Endocrine: Negative.   Genitourinary: Negative.   Musculoskeletal: Negative.   Skin: Positive for color change (small area under left arm pit/ reports he just noticed last week/ he reports he first noticed itching and mild pain in area/ denies redness). Negative for pallor, rash and wound.  Neurological: Negative.   Hematological: Negative.   Psychiatric/Behavioral: Negative.        Objective:   Physical Exam  Constitutional: He is oriented to person, place, and time. Vital signs are normal. He appears well-developed and well-nourished. He is active.  Non-toxic appearance. He does not  have a sickly appearance. He does not appear ill. No distress.  HENT:  Head: Normocephalic and atraumatic.  Right Ear: Hearing and ear canal normal. Tympanic membrane is erythematous (mild to moderate- patient denies pain ). A middle ear effusion is present.  Left Ear: Hearing, tympanic membrane, external ear and ear canal normal.  Nose: Mucosal edema and rhinorrhea present. No nose lacerations, sinus tenderness, nasal deformity, septal deviation or nasal septal hematoma. No epistaxis.  No foreign bodies. Right sinus exhibits no maxillary sinus tenderness and no frontal sinus tenderness. Left sinus exhibits no maxillary sinus tenderness and no frontal sinus tenderness.  Mouth/Throat: Uvula is midline and mucous membranes are normal. Mucous membranes are not pale, not dry and not cyanotic. He does not have dentures. No oral lesions. No trismus in the jaw. Normal dentition. No dental abscesses, uvula swelling, lacerations or dental caries. Posterior oropharyngeal erythema present. No oropharyngeal exudate, posterior oropharyngeal edema or tonsillar abscesses.  Eyes: Pupils are equal, round, and reactive to light. Conjunctivae, EOM and lids are normal. Right eye exhibits no discharge. Left eye exhibits no discharge.  Neck: Trachea normal, normal range of motion, full passive range of motion without pain and phonation normal. Neck supple. Normal carotid pulses, no hepatojugular reflux and no JVD present. No tracheal tenderness present. Carotid bruit is not present. No tracheal deviation present. No Brudzinski's sign noted. No thyroid mass and no thyromegaly present.  Cardiovascular: Normal rate, regular rhythm, S1 normal, S2  normal, normal heart sounds, intact distal pulses and normal pulses.  Exam reveals no gallop, no distant heart sounds and no friction rub.   No murmur heard. Pulmonary/Chest: Effort normal and breath sounds normal. No accessory muscle usage or stridor. No respiratory distress. He has no  wheezes. He has no rales. He exhibits no tenderness.  Abdominal: Soft. Normal appearance, normal aorta and bowel sounds are normal. He exhibits no distension and no mass. There is no tenderness. There is no rebound and no guarding.  Musculoskeletal: Normal range of motion. He exhibits no edema, tenderness or deformity.  Lymphadenopathy:       Head (right side): No submental, no submandibular, no tonsillar, no preauricular, no posterior auricular and no occipital adenopathy present.       Head (left side): No submental, no submandibular, no tonsillar, no preauricular, no posterior auricular and no occipital adenopathy present.    He has no cervical adenopathy.    He has no axillary adenopathy.  Neurological: He is alert and oriented to person, place, and time. He has normal strength and normal reflexes. No cranial nerve deficit or sensory deficit. He exhibits normal muscle tone. He displays a negative Romberg sign. Coordination normal. GCS eye subscore is 4. GCS verbal subscore is 5. GCS motor subscore is 6.  Skin: Skin is warm, dry and intact. Rash noted. No abrasion, no bruising, no burn, no ecchymosis, no laceration, no lesion, no petechiae and no purpura noted. Rash is macular (small pinpoint brown mole regular in circumference -  new lesion per patient under left axillary). Rash is not papular, not maculopapular, not nodular, not pustular, not vesicular and not urticarial. He is not diaphoretic. No cyanosis or erythema. No pallor. Nails show no clubbing.  Psychiatric: He has a normal mood and affect. His speech is normal and behavior is normal. Judgment and thought content normal. Cognition and memory are normal.       Assessment:    Otitis externa, unspecified chronicity, unspecified left   Skin lesion - Plan: Ambulatory referral to Dermatology      Plan:     Orders Placed This Encounter  Procedures  . Ambulatory referral to Dermatology    Referral Priority:   Urgent    Referral Type:    Consultation    Referral Reason:   Specialty Services Required    Requested Specialty:   Dermatology    Number of Visits Requested:   1      Refer to Dermatology for new skin lesion left axillary to rule out typical versus atypical nevi. His is a new lesion per patient.   Will treat Otitis externa E - Prescribed  Meds ordered this encounter  Medications  . amoxicillin-clavulanate (AUGMENTIN) 875-125 MG tablet    Sig: Take 1 tablet by mouth 2 (two) times daily.    Dispense:  14 tablet    Refill:  0   Return to clinic at any time  if any new symptoms change, worsen or do not improve. Symptoms should improve  within 72 hours and if not improving you should call for an appointment at the clinic or be seen in urgent care/ED if clinic is closed. 911 for any emergency symptoms.  Patient verbalized above understanding of all instructions.   Patient verbalized understanding of instructions and denies any further questions at this time.

## 2016-10-10 ENCOUNTER — Other Ambulatory Visit: Payer: Self-pay | Admitting: Medical

## 2016-10-10 DIAGNOSIS — F419 Anxiety disorder, unspecified: Secondary | ICD-10-CM

## 2016-11-08 ENCOUNTER — Ambulatory Visit: Payer: Self-pay | Admitting: Medical

## 2016-11-10 ENCOUNTER — Ambulatory Visit: Payer: Self-pay | Admitting: Adult Health

## 2016-11-10 ENCOUNTER — Encounter: Payer: Self-pay | Admitting: Adult Health

## 2016-11-10 VITALS — BP 110/78 | HR 89 | Temp 97.8°F | Resp 16 | Wt 193.0 lb

## 2016-11-10 DIAGNOSIS — F411 Generalized anxiety disorder: Secondary | ICD-10-CM

## 2016-11-10 DIAGNOSIS — F419 Anxiety disorder, unspecified: Secondary | ICD-10-CM

## 2016-11-10 MED ORDER — SERTRALINE HCL 50 MG PO TABS: 50.0000 mg | ORAL_TABLET | Freq: Every day | ORAL | 0 refills | Status: DC

## 2016-11-10 NOTE — Progress Notes (Signed)
Subjective:     Patient ID: Antonio Rollins, male   DOB: 1986/01/11, 31 y.o.   MRN: [id number]  HPI   Patient is a 31 year old in no acute distress for refill of Sertraline 50 mg. He reports he is taking one tablet daily.  Denies any panic attacks, or behavioral changes.He denies any fever,  rash, chest pain, shortness of breath, nausea, vomiting, or diarrhea. He denies any suicidal or homicidal ideations. 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 He reports he is now able to enjoy time with his children and family instead of coming home after they are in bed.  He denies any cardiac history. He denies any behavioral changes and reports his family can tell he feels so much better now.   Vitals:   11/10/16 1305  Weight: 193 lb (87.5 kg)   Blood pressure 110/78, pulse 89, temperature 97.8 F (36.6 C), resp. rate 16, weight 193 lb (87.5 kg), SpO2 97 %..   Current Outpatient Prescriptions:  .  albuterol (PROAIR HFA) 108 (90 Base) MCG/ACT inhaler, Inhale into the lungs., Disp: , Rfl:  .  budesonide (PULMICORT) 0.5 MG/2ML nebulizer solution, USE 2ML VIA NEB ONCE DAILY, Disp: , Rfl: 11 .  montelukast (SINGULAIR) 10 MG tablet, Take 10 mg by mouth at bedtime., Disp: , Rfl:  .  PROAIR HFA 108 (90 Base) MCG/ACT inhaler, Inhale 0.5 mg into the lungs as needed., Disp: , Rfl: 3 .  sertraline (ZOLOFT) 50 MG tablet, TAKE 1 TABLET BY MOUTH EVERY DAY, Disp: 30 tablet, Rfl: 1 .  amoxicillin-clavulanate (AUGMENTIN) 875-125 MG tablet, Take 1 tablet by mouth 2 (two) times daily. (Patient not taking: Reported on 11/10/2016), Disp: 14 tablet, Rfl: 0       Review of Systems  Constitutional: Negative.   HENT: Negative.   Eyes: Negative.   Respiratory: Negative for apnea, cough, choking, chest tightness, shortness of breath, wheezing (controlled by inhalers ordered by pulmonary- denies any current wheezing. )  and stridor.   Cardiovascular: Negative for chest pain, palpitations and leg swelling.  Gastrointestinal: Negative.   Endocrine: Negative.   Genitourinary: Negative.   Musculoskeletal: Negative.   Skin: Negative.   Allergic/Immunologic: Negative.   Neurological: Negative.   Hematological: Negative.   Psychiatric/Behavioral: Negative for agitation, behavioral problems, confusion, decreased concentration, dysphoric mood, hallucinations, self-injury, sleep disturbance and suicidal ideas. The patient is not nervous/anxious and is not hyperactive.        Objective:   Physical Exam  Constitutional: He is oriented to person, place, and time. He appears well-developed and well-nourished. No distress.  HENT:  Head: Normocephalic and atraumatic.  Neck: Normal range of motion. Neck supple. No JVD present. No tracheal deviation present. No thyromegaly present.  Cardiovascular: Normal rate, regular rhythm, normal heart sounds and intact distal pulses.  Exam reveals no gallop and no friction rub.   No murmur heard. Pulmonary/Chest: Effort normal and breath sounds normal. No stridor. No respiratory distress. He has no wheezes. He has no rales. He exhibits no tenderness.  Abdominal: Soft. Bowel sounds are normal. He exhibits no distension. There is no tenderness.  Lymphadenopathy:    He has no cervical adenopathy.  Neurological: He is alert and oriented to person, place, and time. He has normal reflexes. He displays normal reflexes. No cranial nerve deficit. He exhibits normal muscle tone. Coordination normal.  Skin: Skin is  warm and dry. No erythema.  Psychiatric: He has a normal mood and affect. His behavior is normal. Judgment and thought content normal. His mood appears not anxious. His affect is not angry, not blunt, not labile and not inappropriate. His speech is not rapid and/or pressured, not delayed, not tangential and not slurred. He is not agitated, not aggressive, not hyperactive, not slowed,  not withdrawn, not actively hallucinating and not combative. Thought content is not paranoid and not delusional. Cognition and memory are normal. Cognition and memory are not impaired. He does not express impulsivity or inappropriate judgment. He does not exhibit a depressed mood. He expresses no homicidal and no suicidal ideation. He expresses no suicidal plans and no homicidal plans. He is communicative. He exhibits normal recent memory and normal remote memory.  Pleasant behavior, engages in eye contact and conversations.   Patient moves on and off of exam table and in room without difficulty. Gait is normal in hall and in room. Patient is oriented to person place time and situation. Patient answers questions appropriately and engages in conversation.   He is attentive.  Vitals reviewed.      Assessment:     Generalized anxiety disorder  Follow up     Plan:     E prescribed only Zoloft as below.     Meds ordered this encounter  Medications  . sertraline (ZOLOFT) 50 MG tablet    Sig: Take 1 tablet (50 mg total) by mouth daily.    Dispense:  90 tablet    Refill:  0    Follow up with the office for recheck in three months- January 2019 for any additional refills.  Patient instructed to follow up as needed if any changes or concerns.  Patient is to call 911 if any emergent symptoms, seek urgent care or Emergency room if any symptoms change worsen or develop.   Patient verbalized understanding of instructions and denies any further questions at this time.

## 2016-11-10 NOTE — Patient Instructions (Signed)

## 2017-02-09 ENCOUNTER — Ambulatory Visit: Payer: Self-pay | Admitting: Adult Health

## 2017-02-09 ENCOUNTER — Encounter: Payer: Self-pay | Admitting: Adult Health

## 2017-02-09 VITALS — BP 122/74 | HR 86 | Temp 98.1°F | Resp 16 | Ht 70.0 in | Wt 207.0 lb

## 2017-02-09 DIAGNOSIS — K131 Cheek and lip biting: Secondary | ICD-10-CM

## 2017-02-09 DIAGNOSIS — F419 Anxiety disorder, unspecified: Secondary | ICD-10-CM

## 2017-02-09 DIAGNOSIS — K1370 Unspecified lesions of oral mucosa: Secondary | ICD-10-CM

## 2017-02-09 MED ORDER — AMOXICILLIN-POT CLAVULANATE 875-125 MG PO TABS: 1.0000 | ORAL_TABLET | Freq: Two times a day (BID) | ORAL | 0 refills | Status: DC

## 2017-02-09 MED ORDER — SERTRALINE HCL 50 MG PO TABS: 50.0000 mg | ORAL_TABLET | Freq: Every day | ORAL | 1 refills | Status: DC

## 2017-02-09 NOTE — Patient Instructions (Signed)
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  Cancer of the lip occurs when cells on the lip, often the lower lip, become abnormal and grow out of control. This usually starts in very thin, flat cells that line the surface of the lip (squamous cells). Cancer cells can spread and form a mass of cells called a tumor. The cancer may spread deeper into the lip, or it may spread to other areas of the body (metastasize). What are the causes? The cause of this condition is not known. What increases the risk? This condition is more likely to develop in:  People who use tobacco products, such as cigarettes, chewing tobacco, and e-cigarettes. Tobacco use is the number-one risk factor of cancer of the lip.  Men.  People who: ? Are over age 42. ? Drink alcohol excessively. People who use both tobacco and alcohol are at an even higher risk. ? Have HPV (human papillomavirus) infection. ? Do not brush or floss their teeth regularly. ? Are frequently exposed to sunlight or fake (artificial) sunlight, such as in a tanning bed.  What are the signs or symptoms? Symptoms of this condition may include:  A lump or open sore (ulcer) on the lip that does not heal. This is the most common symptom.  Bleeding from the lip.  Pain or numbness in the lip.  Drooling while taking a drink.  A lump on the neck.  In some cases, this condition causes no symptoms and it may be found during a regular dental exam. How is this diagnosed? This condition may be diagnosed based on:  A physical exam of your lips, mouth, and neck. To look at the inside of your mouth and your throat, your health care provider may use a thin, long-handled mirror or a thin, flexible tube that has a light and a camera at the end (fiberscope).  Removal and exam of a small number of cells (biopsy) from your lip, your mouth, or a lump on your neck. The cells are checked under a  microscope for cancerous formations.  Blood tests. These tests may include a complete blood count, an electrolytes test, and tests of your kidney and liver function.  Imaging exams of your lip, mouth, and body, such as: ? X-rays. ? CT scan. ? PET scan. ? MRI. ? Bone scan.  If cancer of the lip is confirmed, it will be staged to determine its severity and extent. Staging is an assessment of:  The size of the tumor.  Whether the cancer has spread.  Where the cancer has spread.  The stages of cancer of the lip are as follows:  Stage 0, carcinoma in situ (CIS). In this stage, abnormal cells that could become cancerous have been found on your lip.  Stage I. Cancer is the size of a peanut or smaller. It has not metastasized.  Stage II. Cancer is larger than a peanut, but not larger than a walnut. It has not metastasized.  Stage III. Cancer has grown larger than a walnut. It may have spread to a lymph node on the same side of your neck as the cancer. Lymph nodes are part of your body's disease-fighting (immune) system. Lymph nodes are found in many locations in your body, including the neck, underarm, and groin.  Stage IV. This stage is divided into three sub-stages, IVA, IVB, and IVC. Cancer has spread to nearby areas. It may have  spread heavily into your lymph nodes or metastasized to other parts of your body.  Cancer may return (recur) after initial treatment (recurrent cancer). Recurrent cancer can occur in the same location or in another part of the body. How is this treated? Treatment for this condition depends on the stage of the cancer. Treatment options may include one or more of the following:  Surgery. This removes as much of the cancer as possible. ? Surgery for stage I and stage II lip cancer will not change the appearance or function of your lip very much. ? Surgery for stage III or stage IV cancers may change the appearance and function of your lip.  Chemotherapy. This  uses medicines to kill the cancer cells.  Radiation therapy. This uses high-energy rays to kill the cancer cells.  Targeted drug therapy. This uses medicines that block cancer from growing and spreading.  A combination of radiation, chemotherapy, targeted drug therapy and surgery may be used for stage III, stage IV, and recurrent cancers. Follow these instructions at home: Medicines  Take over-the-counter and prescription medicines only as told by your health care provider.  Do not drive or operate heavy machinery while taking prescription pain medicine. General instructions   Return to your normal activities as told by your health care provider. Ask your health care provider what activities are safe for you.  Work with your health care provider to manage side effects of treatment.  Maintain a healthy diet.  Do not use any tobacco products, such as cigarettes, chewing tobacco, and e-cigarettes. If you need help quitting, ask your health care provider.  Do not use alcohol.  Keep all follow-up visits as told by your health care provider. This is important. Contact a health care provider if:  You have pain that does not get better with medicine or gets worse.  You have bleeding from your lip or mouth.  Your lip is numb.  You have swelling in your lip, mouth, or neck.  You have difficulty swallowing.  You develop new symptoms. Get help right away if:  You have severe pain in your lip, neck, or mouth.  You have difficulty breathing. This information is not intended to replace advice given to you by your health care provider. Make sure you discuss any questions you have with your health care provider. Document Released: 04/21/2010 Document Revised: 06/12/2015 Document Reviewed: 09/09/2014 Elsevier Interactive Patient Education  2018 [employer] Living With Anxiety After being diagnosed with an anxiety disorder, you may be relieved to know why you have felt or behaved a  certain way. It is natural to also feel overwhelmed about the treatment ahead and what it will mean for your life. With care and support, you can manage this condition and recover from it. How to cope with anxiety Dealing with stress Stress is your body's reaction to life changes and events, both good and bad. Stress can last just a few hours or it can be ongoing. Stress can play a major role in anxiety, so it is important to learn both how to cope with stress and how to think about it differently. Talk with your health care provider or a counselor to learn more about stress reduction. He or she may suggest some stress reduction techniques, such as:  Music therapy. This can include creating or listening to music that you enjoy and that inspires you.  Mindfulness-based meditation. This involves being aware of your normal breaths, rather than trying to control your breathing. It can  be done while sitting or walking.  Centering prayer. This is a kind of meditation that involves focusing on a word, phrase, or sacred image that is meaningful to you and that brings you peace.  Deep breathing. To do this, expand your stomach and inhale slowly through your nose. Hold your breath for 3-5 seconds. Then exhale slowly, allowing your stomach muscles to relax.  Self-talk. This is a skill where you identify thought patterns that lead to anxiety reactions and correct those thoughts.  Muscle relaxation. This involves tensing muscles then relaxing them.  Choose a stress reduction technique that fits your lifestyle and personality. Stress reduction techniques take time and practice. Set aside 5-15 minutes a day to do them. Therapists can offer training in these techniques. The training may be covered by some insurance plans. Other things you can do to manage stress include:  Keeping a stress diary. This can help you learn what triggers your stress and ways to control your response.  Thinking about how you respond  to certain situations. You may not be able to control everything, but you can control your reaction.  Making time for activities that help you relax, and not feeling guilty about spending your time in this way.  Therapy combined with coping and stress-reduction skills provides the best chance for successful treatment. Medicines Medicines can help ease symptoms. Medicines for anxiety include:  Anti-anxiety drugs.  Antidepressants.  Beta-blockers.  Medicines may be used as the main treatment for anxiety disorder, along with therapy, or if other treatments are not working. Medicines should be prescribed by a health care provider. Relationships Relationships can play a big part in helping you recover. Try to spend more time connecting with trusted friends and family members. Consider going to couples counseling, taking family education classes, or going to family therapy. Therapy can help you and others better understand the condition. How to recognize changes in your condition Everyone has a different response to treatment for anxiety. Recovery from anxiety happens when symptoms decrease and stop interfering with your daily activities at home or work. This may mean that you will start to:  Have better concentration and focus.  Sleep better.  Be less irritable.  Have more energy.  Have improved memory.  It is important to recognize when your condition is getting worse. Contact your health care provider if your symptoms interfere with home or work and you do not feel like your condition is improving. Where to find help and support: You can get help and support from these sources:  Self-help groups.  Online and [employer].  A trusted spiritual leader.  Couples counseling.  Family education classes.  Family therapy.  Follow these instructions at home:  Eat a healthy diet that includes plenty of vegetables, fruits, whole grains, low-fat dairy products, and lean  protein. Do not eat a lot of foods that are high in solid fats, added sugars, or salt.  Exercise. Most adults should do the following: ? Exercise for at least 150 minutes each week. The exercise should increase your heart rate and make you sweat (moderate-intensity exercise). ? Strengthening exercises at least twice a week.  Cut down on caffeine, tobacco, alcohol, and other potentially harmful substances.  Get the right amount and quality of sleep. Most adults need 7-9 hours of sleep each night.  Make choices that simplify your life.  Take over-the-counter and prescription medicines only as told by your health care provider.  Avoid caffeine, alcohol, and certain over-the-counter cold medicines. These  may make you feel worse. Ask your pharmacist which medicines to avoid.  Keep all follow-up visits as told by your health care provider. This is important. Questions to ask your health care provider  Would I benefit from therapy?  How often should I follow up with a health care provider?  How long do I need to take medicine?  Are there any long-term side effects of my medicine?  Are there any alternatives to taking medicine? Contact a health care provider if:  You have a hard time staying focused or finishing daily tasks.  You spend many hours a day feeling worried about everyday life.  You become exhausted by worry.  You start to have headaches, feel tense, or have nausea.  You urinate more than normal.  You have diarrhea. Get help right away if:  You have a racing heart and shortness of breath.  You have thoughts of hurting yourself or others. If you ever feel like you may hurt yourself or others, or have thoughts about taking your own life, get help right away. You can go to your nearest emergency department or call:  Your local emergency services (911 in the [place]  A suicide crisis helpline, such as the National Suicide Prevention Lifeline at (857) 439-0895. This is  open 24-hours a day.  Summary  Taking steps to deal with stress can help calm you.  Medicines cannot cure anxiety disorders, but they can help ease symptoms.  Family, friends, and partners can play a big part in helping you recover from an anxiety disorder. This information is not intended to replace advice given to you by your health care provider. Make sure you discuss any questions you have with your health care provider. Document Released: 12/30/2015 Document Revised: 12/30/2015 Document Reviewed: 12/30/2015 Elsevier Interactive Patient Education  [employer].

## 2017-02-09 NOTE — Progress Notes (Signed)
Subjective:     Patient ID: Antonio Rollins, male   DOB: 10/07/1985, 32 y.o.   MRN: [id number]  HPI   Blood pressure 122/74, pulse 86, temperature 98.1 F (36.7 C), resp. rate 16, height 5\' 10"  (1.778 m), weight 207 lb (93.9 kg), SpO2 97 %.  Patient is a 32 year male who reports to the clinic in no acute distress.  Is here for follow-up regarding Zoloft and refills for anxiety.  Patient reports that he is doing well with the medication taking 50 mg of Zoloft daily.  He denies any suicidal or homicidal ideations.  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     He also reports that he has had nasal and chest  Congestion, sinus pressure  x 1.5 weeks he has been doing hot showers and Mucinex over the counter with minimal relief. He reports that within this time he noticed an irritated area that feels like a small " bump" inside the right side of  lower lip he reports this area has been present x 1 week since becoming sick.  He also reports history of " habit biting his lower lip" , and this has increased since he no tied this area. He denies any drainage or discharge. Mild irritation but denies any significant pain.   Denies drainage, no pain.   He does use "  non tobacco chewing tobacco replacement " now and reports  He has  used regular chewing tobacco  " for years" and quit 12 months ago replacing it with the above substitute in order to have something in hios lip to chew per his report.   Patient  denies any fever, chills, rash, chest pain, shortness of breath, nausea, vomiting, or diarrhea. He denies any STD exposures or ill contacts.  Social History   Socioeconomic History  . Marital status: Married    Spouse name: Not on file  . Number of children: Not on file  . Years of education: Not on file  . Highest education  level: Not on file  Social Needs  . Financial resource strain: Not on file  . Food insecurity - worry: Not on file  . Food insecurity - inability: Not on file  . Transportation needs - medical: Not on file  . Transportation needs - non-medical: Not on file  Occupational History  . Not on file  Tobacco Use  . Smoking status: Former Smoker    Last attempt to quit: 02/10/2012    Years since quitting: 5.0  . Smokeless tobacco: [employer]    Quit date: 02/10/2016  . Tobacco comment:  reports he quit tobacco dip 12 months ago but uses " non tobacco chewing dip"  since that time   Substance and Sexual Activity  . Alcohol use: Yes    Alcohol/week: 1.2 oz    Types: 2 Shots of liquor per week  . Drug use: No  . Sexual activity: Not on file  Other Topics Concern  . Not on file  Social History Narrative  . Not on file     Review of Systems  Constitutional: Negative.   HENT: Positive for congestion, mouth sores (right lower lip x 1 week ), postnasal drip, sinus pressure and sore throat. Negative for trouble swallowing.   Respiratory: Negative.   Cardiovascular:  Negative.   Gastrointestinal: Negative.   Genitourinary: Negative.   Musculoskeletal: Negative.   Skin: Negative.   Allergic/Immunologic: Negative.   Neurological: Negative.   Psychiatric/Behavioral: Negative.        Objective:   Physical Exam  Constitutional: He is oriented to person, place, and time. Vital signs are normal. He appears well-developed and well-nourished. He is sleeping and active.  Non-toxic appearance. He does not have a sickly appearance. He does not appear ill. No distress.  HENT:  Head: Normocephalic and atraumatic.  Right Ear: Hearing and external ear normal. No drainage or tenderness. Tympanic membrane is erythematous (mild ). Tympanic membrane is not injected and not perforated. No middle ear effusion.  Left Ear: Hearing, external ear and ear canal normal. No drainage or tenderness. Tympanic membrane  is not injected, not perforated and not erythematous.  No middle ear effusion.  Nose: Mucosal edema and rhinorrhea present. Right sinus exhibits no maxillary sinus tenderness and no frontal sinus tenderness. Left sinus exhibits no maxillary sinus tenderness and no frontal sinus tenderness.  Mouth/Throat: Uvula is midline and mucous membranes are normal. Mucous membranes are not pale, not dry and not cyanotic. Oral lesions (right inner  lower lip located midway from center of lip to right corner-   see comments ) present. No uvula swelling. Posterior oropharyngeal erythema present. No oropharyngeal exudate, posterior oropharyngeal edema or tonsillar abscesses.  Right tonsil 2 + with erythema no exudate noted   Patients right lower lip with small round single lump  approximately 0.2cm x 0.2 cm soft mobile palpated. Mildly tender with touch, no drainage. Area is normal lip colored pink overlying with small area on over lying skin that appears to have been cut by tooth/ trauma.  Patient denies any forced trauma but does report he bite his lower lip very often as a habit.    Eyes: Conjunctivae and EOM are normal. Pupils are equal, round, and reactive to light. Right eye exhibits no discharge. Left eye exhibits no discharge. No scleral icterus.  Neck: Normal range of motion and full passive range of motion without pain. Neck supple. No JVD present. No tracheal deviation present.  Cardiovascular: Normal rate, regular rhythm, normal heart sounds and intact distal pulses. Exam reveals no gallop and no friction rub.  No murmur heard. Pulmonary/Chest: Effort normal and breath sounds normal. No stridor. No respiratory distress. He has no wheezes. He has no rales. He exhibits no tenderness.  Abdominal: Soft. Bowel sounds are normal.  Musculoskeletal: Normal range of motion.  Patient moves on and off of exam table and in room without difficulty. Gait is normal in hall and in room. Patient is oriented to person  place time and situation. Patient answers questions appropriately and engages in conversation.   Lymphadenopathy:    He has no cervical adenopathy.  Neurological: He is alert and oriented to person, place, and time. He displays normal reflexes. No cranial nerve deficit. He exhibits normal muscle tone. Coordination normal.  Skin: Skin is warm and dry. No rash noted. He is not diaphoretic. No erythema. No pallor.  Psychiatric: He has a normal mood and affect. His speech is normal and behavior is normal. Judgment and thought content normal. Cognition and memory are normal.  Vitals reviewed.      Assessment:    Oral mucosal lesion  Anxiety - Plan: sertraline (ZOLOFT) 50 MG tablet  Biting lips   See physical exam note for oral lesion right lower lip present x 1 week from this visit  per patient report.      Plan:     Follow-up appointment for recheck of right lower lip inner should be scheduled for 1-2 weeks after finishing antibiotic for sinusitis.  Discussed many differentials with patient including but not limited to oral carcinoma cancer with history of oral tobacco use traumatized area due to lip biting cyst, or ulcer.  If area does not resolve patient will need a referral to ENT for further evaluation and specialist opinion.  Advised to try to refrain from biting lips, cold pack to decrease swelling, office or calling the office if any symptoms change or worsen at any time.  Seeking after hours care at after Hours medical facility if this office is closed.  Recheck for anxiety is due again in 6 months from today's visit patient is aware that he will schedule this appointment before he runs out of refills 6 months of refills were given as patient reports he has been on this medication for a long time and doing well he is aware that he will call the office if any change or worsen at any time.  He will also seek after hours care if this office is closed at any time.  He is aware to call 911  for any suicidal or homicidal ideations.  Advised to return to clinic for an appointment if no improvement within 72 hours or if any symptoms change or worsen. Advised ER or urgent Care if after hours or on weekend. 911 for emergency symptoms at any time.   Patient verbalized understanding of all instructions given and denies any further questions at this time.  Refills as below  Meds ordered this encounter  Medications  . amoxicillin-clavulanate (AUGMENTIN) 875-125 MG tablet    Sig: Take 1 tablet by mouth 2 (two) times daily.    Dispense:  20 tablet    Refill:  0  . sertraline (ZOLOFT) 50 MG tablet    Sig: Take 1 tablet (50 mg total) by mouth daily.    Dispense:  90 tablet    Refill:  1

## 2017-02-16 ENCOUNTER — Ambulatory Visit: Payer: Self-pay | Admitting: Adult Health

## 2017-09-22 ENCOUNTER — Other Ambulatory Visit: Payer: Self-pay | Admitting: Adult Health

## 2017-09-22 DIAGNOSIS — F419 Anxiety disorder, unspecified: Secondary | ICD-10-CM

## 2017-10-07 ENCOUNTER — Encounter: Payer: Self-pay | Admitting: Adult Health

## 2017-10-07 ENCOUNTER — Ambulatory Visit: Payer: Self-pay | Admitting: Adult Health

## 2017-10-07 VITALS — BP 138/80 | HR 78 | Temp 97.9°F | Resp 16 | Ht 69.0 in | Wt 208.0 lb

## 2017-10-07 DIAGNOSIS — F419 Anxiety disorder, unspecified: Secondary | ICD-10-CM

## 2017-10-07 MED ORDER — SERTRALINE HCL 50 MG PO TABS: 50.0000 mg | ORAL_TABLET | Freq: Every day | ORAL | 1 refills | Status: DC

## 2017-10-07 NOTE — Patient Instructions (Signed)
Agoraphobia Agoraphobia is a mental health disorder. It is a type of anxiety or fear. People with agoraphobia fear public places where they may be trapped, helpless, or embarrassed in the event of a panic attack or a loss of control. They often start to avoid the feared situations or insist that another person go with them. Agoraphobia may interfere with normal daily activities and personal relationships. People with severe agoraphobia may become completely homebound and dependent on others for grocery shopping and other errands. Agoraphobia usually begins before age 35, but it can start in the older adult years. People with agoraphobia are at risk for other anxiety disorders, depression, and substance abuse. What are the causes? It is not known exactly what causes agoraphobia. What increases the risk? Agoraphobia is more common in women. People who have panic disorder or have family members with agoraphobia are at higher risk of developing agoraphobia. What are the signs or symptoms? You may have agoraphobia if you have the following symptoms for 6 months or longer:  Intense fear about two or more of the following: ? Using public transportation, such as cars, buses, planes, trains, or ships. ? Being in open spaces, such as parking lots, shopping malls, or bridges. ? Being in enclosed spaces, such as shops, theaters, or elevators. ? Standing in line or being in a crowd. ? Being outside the home alone.  Fear that is due to thoughts of being unable to escape or get help if certain events occur. The feared event may be a panic attack or panic-like symptoms, such as a racing heart, dizziness, and trouble breathing. In older people, the feared event may be a fall or loss of bowel control.  Reacting to feared situations by: ? Avoiding them. ? Requiring the presence of a companion. ? Enduring them with intense fear or anxiety.  Fear or anxiety that is out of proportion to the actual danger that is  posed by the event and the situation.  How is this diagnosed? Agoraphobia may be diagnosed by your health care provider. You will be asked questions about your fears and how they have affected you. You may be asked about your medical history and your use of medicines, alcohol, or drugs. Your health care provider may do a physical exam and order lab tests or other studies to rule out a medical condition. You may also be referred to a mental health specialist. How is this treated? Treatment usually includes a combination of counseling and medicines.  Counseling or talk therapy. Talk therapy is provided by mental health specialists. The following forms of talk therapy can be especially helpful: ? Cognitive therapy. Cognitive therapy helps you to recognize and change unrealistic thoughts and beliefs that contribute to your fears. ? Exposure therapy. Exposure therapy helps you to face and overcome your fears in a relaxed state and a safe environment.  Medicines. The following types of medicines may be helpful: ? Antidepressants. Antidepressants are believed to affect certain chemicals in your brain. They can decrease general levels of anxiety and can help to prevent panic attacks. ? Benzodiazepines. These medicines block feelings of anxiety and panic. They are very effective and act more quickly than antidepressants, but they are highly addictive. These medicines are recommended only for short-term use. ? Beta blockers. Beta blockers can reduce physical symptoms of anxiety, such as a racing heart, sweating, and tremor. They may help you to feel less tense and anxious.  Follow these instructions at home:  Keep all follow-up visits as   directed by your health care provider. This is important.  Take all medicines only as directed by your health care provider.  Try to exercise, eat a healthy diet, and get plenty of sleep.  Do not drink alcohol.  Do not use illegal drugs. Where to find more  information: For more information, visit the website of the Anxiety and [employer] Association of [place] (ADAA): ProgramCam.dewww.adaa.org Contact a health care provider if:  Your fear or anxiety gets worse.  You have new fears or anxieties. Get help right away if:  You have serious thoughts about hurting yourself or someone else.  You have trouble breathing or have chest pain. This information is not intended to replace advice given to you by your health care provider. Make sure you discuss any questions you have with your health care provider. Document Released: 05/27/2010 Document Revised: 06/12/2015 Document Reviewed: 05/07/2013 Elsevier Interactive Patient Education  2018 [employer] Escitalopram oral solution What is this medicine? ESCITALOPRAM (es sye TAL oh pram) is used to treat depression and certain types of anxiety. This medicine may be used for other purposes; ask your health care provider or pharmacist if you have questions. COMMON BRAND NAME(S): Lexapro What should I tell my health care provider before I take this medicine? They need to know if you have any of these conditions: -bipolar disorder or a family history of bipolar disorder -diabetes -glaucoma -heart disease -kidney or liver disease -receiving electroconvulsive therapy -seizures (convulsions) -suicidal thoughts, plans, or attempt by you or a family member -an unusual or allergic reaction to escitalopram, citalopram, other medicines, foods, dyes, or preservatives -pregnant or trying to become pregnant -breast-feeding How should I use this medicine? Take this medicine by mouth. Follow the directions on the prescription label. Use a specially marked spoon or container to measure your medicine. Ask your pharmacist if you do not have one. Household spoons are not accurate. This medicine can be taken with or without food. Take your medicine at regular intervals. Do not take it more often than directed. Do not stop taking  this medicine suddenly except upon the advice of your doctor. Stopping this medicine too quickly may cause serious side effects or your condition may worsen. A special MedGuide will be given to you by the pharmacist with each prescription and refill. Be sure to read this information carefully each time. Talk to your pediatrician regarding the use of this medicine in children. Special care may be needed. Overdosage: If you think you have taken too much of this medicine contact a poison control center or emergency room at once. NOTE: This medicine is only for you. Do not share this medicine with others. What if I miss a dose? If you miss a dose, take it as soon as you can. If it is almost time for your next dose, take only that dose. Do not take double or extra doses. What may interact with this medicine? Do not take this medicine with any of the following medications: -certain medicines for fungal infections like fluconazole, itraconazole, ketoconazole, posaconazole, voriconazole -cisapride -citalopram -dofetilide -dronedarone -linezolid -MAOIs like Carbex, Eldepryl, Marplan, Nardil, and Parnate -methylene blue (injected into a vein) -pimozide -thioridazine -ziprasidone This medicine may also interact with the following medications: -alcohol -amphetamines -aspirin and aspirin-like medicines -carbamazepine -certain medicines for depression, anxiety, or psychotic disturbances -certain medicines for migraine headache like almotriptan, eletriptan, frovatriptan, naratriptan, rizatriptan, sumatriptan, zolmitriptan -certain medicines for sleep -certain medicines that treat or prevent blood clots like warfarin, enoxaparin, and dalteparin -cimetidine -diuretics -  fentanyl -furazolidone -isoniazid -lithium -metoprolol -NSAIDs, medicines for pain and inflammation, like ibuprofen or naproxen -other medicines that prolong the QT interval (cause an abnormal heart  rhythm) -procarbazine -rasagiline -supplements like St. John's wort, kava kava, valerian -tramadol -tryptophan This list may not describe all possible interactions. Give your health care provider a list of all the medicines, herbs, non-prescription drugs, or dietary supplements you use. Also tell them if you smoke, drink alcohol, or use illegal drugs. Some items may interact with your medicine. What should I watch for while using this medicine? Tell your doctor if your symptoms do not get better or if they get worse. Visit your doctor or health care professional for regular checks on your progress. Because it may take several weeks to see the full effects of this medicine, it is important to continue your treatment as prescribed by your doctor. Patients and their families should watch out for new or worsening thoughts of suicide or depression. Also watch out for sudden changes in feelings such as feeling anxious, agitated, panicky, irritable, hostile, aggressive, impulsive, severely restless, overly excited and hyperactive, or not being able to sleep. If this happens, especially at the beginning of treatment or after a change in dose, call your health care professional. Bonita Quin may get drowsy or dizzy. Do not drive, use machinery, or do anything that needs mental alertness until you know how this medicine affects you. Do not stand or sit up quickly, especially if you are an older patient. This reduces the risk of dizzy or fainting spells. Alcohol may interfere with the effect of this medicine. Avoid alcoholic drinks. Your mouth may get dry. Chewing sugarless gum or sucking hard candy, and drinking plenty of water may help. Contact your doctor if the problem does not go away or is severe. What side effects may I notice from receiving this medicine? Side effects that you should report to your doctor or health care professional as soon as possible: -allergic reactions like skin rash, itching or hives,  swelling of the face, lips, or tongue -anxious -black, tarry stools -changes in vision -confusion -elevated mood, decreased need for sleep, racing thoughts, impulsive behavior -eye pain -fast, irregular heartbeat -feeling faint or lightheaded, falls -feeling agitated, angry, or irritable -hallucination, loss of contact with reality -loss of balance or coordination -loss of memory -restlessness, pacing, inability to keep still -seizures -stiff muscles -suicidal thoughts or other mood changes -trouble sleeping -unusual bleeding or bruising -unusually weak or tired -vomiting Side effects that usually do not require medical attention (report to your doctor or health care professional if they continue or are bothersome): -changes in appetite -change in sex drive or performance -headache -increased sweating -indigestion, nausea -tremors Ths list may not describe all possible side effects. Call your doctor for medical advice about side effects. You may report side effects to FDA at 1-800-FDA-1088. This list may not describe all possible side effects. Call your doctor for medical advice about side effects. You may report side effects to FDA at 1-800-FDA-1088. Where should I keep my medicine? Keep out of reach of children. Store at room temperature between 15 and 30 degrees C (59 and 86 degrees F). Throw away any unused medicine after the expiration date. NOTE: This sheet is a summary. It may not cover all possible information. If you have questions about this medicine, talk to your doctor, pharmacist, or health care provider.  2018 Elsevier/Gold Standard (2015-06-07 15:26:08)

## 2017-10-07 NOTE — Progress Notes (Signed)
Subjective:     Patient ID: Antonio Rollins, male   DOB: 09/24/1985, 32 y.o.   MRN: [id number]  HPI  Blood pressure 138/80, pulse 78, temperature 97.9 F (36.6 C), resp. rate 16, height 5\' 9"  (1.753 m), weight 208 lb (94.3 kg), SpO2 98 %. Betha Loa. [name] is a 32 year old male in no acute distress who comes to the clinic in no acute distress for a follow up on his Lexapro that he takes for generalized anxiety. Sleeping well. Denies fatigue in the day. He reports he has been taking Lexapro daily for a year. 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  He denies any homicidal or suicidal ideations.   Patient  denies any fever, body aches,chills, rash, chest pain, shortness of breath, nausea, vomiting, or diarrhea.   Review of Systems  Constitutional: Negative.   HENT: Negative.   Respiratory: Negative.        Asthma controlled per patient follows with pulmonary   Cardiovascular: Negative.   Gastrointestinal: Negative.   Genitourinary: Negative.   Musculoskeletal: Negative.   Skin: Negative.   Neurological: Negative.   Hematological: Negative.   Psychiatric/Behavioral: Negative.        Objective:   Physical Exam  Constitutional: He is oriented to person, place, and time. He appears well-developed and well-nourished. No distress.  HENT:  Head: Normocephalic and atraumatic.  Eyes: Pupils are equal, round, and reactive to light. EOM are normal.  Neck: Normal range of motion. Neck supple.  Cardiovascular: Normal rate, regular rhythm, normal heart sounds and intact distal pulses. Exam reveals no gallop and no friction rub.  No murmur heard. Pulmonary/Chest: Effort normal and breath sounds normal. No stridor. No respiratory distress. He has no wheezes. He has no rales. He exhibits no tenderness.  Abdominal: Soft.  Musculoskeletal: Normal range of motion.  Neurological: He is alert and oriented to person, place, and time.  Skin: Skin is warm and dry.  He is not diaphoretic.  Psychiatric: He has a normal mood and affect. His behavior is normal. Judgment and thought content normal.  Vitals reviewed.  Patient Health Questionnaire ( PHQ9 )  Score of 0 Generalized Anxiety Disorder 7 -item (GAD-7) scale score of 3    Assessment:    Anxiety     Plan:     . Meds ordered this encounter  Medications  . sertraline (ZOLOFT) 50 MG tablet    Sig: Take 1 tablet (50 mg total) by mouth daily.    Dispense:  90 tablet    Refill:  1    Discussed known black box warning for anti depression/ anxiety medication. Need to report any behavioral changes right, if any homicidal or suicidal thoughts or ideas seek medical attention right away. Call 911.     He is aware he is due for labs and a physical 1-2 weeks following with Doy Minceatcliffe, Heather R, PA-C He will call and schedule these appointments.   Advised patient call the office or your primary care doctor for an appointment if no improvement within 72 hours or if any symptoms change or worsen at any time  Advised ER or urgent Care if after hours or on weekend. Call 911 for emergency symptoms at any time.Patinet verbalized understanding of all instructions given/reviewed and treatment plan and has no further questions or concerns at this time.    Patient verbalized understanding of all instructions given and denies any further questions at this time.

## 2017-11-28 DIAGNOSIS — J4520 Mild intermittent asthma, uncomplicated: Secondary | ICD-10-CM | POA: Diagnosis not present

## 2017-11-28 DIAGNOSIS — R0602 Shortness of breath: Secondary | ICD-10-CM | POA: Diagnosis not present

## 2018-08-01 DIAGNOSIS — J310 Chronic rhinitis: Secondary | ICD-10-CM | POA: Diagnosis not present

## 2018-08-01 DIAGNOSIS — J4520 Mild intermittent asthma, uncomplicated: Secondary | ICD-10-CM | POA: Diagnosis not present

## 2018-08-01 DIAGNOSIS — R05 Cough: Secondary | ICD-10-CM | POA: Diagnosis not present

## 2018-10-12 ENCOUNTER — Other Ambulatory Visit: Payer: Self-pay

## 2018-10-18 ENCOUNTER — Other Ambulatory Visit: Payer: Self-pay | Admitting: Nurse Practitioner

## 2018-10-18 DIAGNOSIS — E559 Vitamin D deficiency, unspecified: Secondary | ICD-10-CM

## 2018-10-18 DIAGNOSIS — F411 Generalized anxiety disorder: Secondary | ICD-10-CM

## 2018-10-18 DIAGNOSIS — E782 Mixed hyperlipidemia: Secondary | ICD-10-CM

## 2018-10-24 ENCOUNTER — Other Ambulatory Visit: Payer: Self-pay

## 2018-10-24 DIAGNOSIS — F411 Generalized anxiety disorder: Secondary | ICD-10-CM

## 2018-10-24 DIAGNOSIS — E782 Mixed hyperlipidemia: Secondary | ICD-10-CM

## 2018-10-25 LAB — CMP12+LP+TP+TSH+6AC+CBC/D/PLT
ALT: 17 IU/L (ref 0–44)
AST: 20 IU/L (ref 0–40)
Albumin/Globulin Ratio: 1.8 (ref 1.2–2.2)
Albumin: 4.6 g/dL (ref 4.0–5.0)
Alkaline Phosphatase: 76 IU/L (ref 39–117)
BUN/Creatinine Ratio: 10 (ref 9–20)
BUN: 11 mg/dL (ref 6–20)
Basophils Absolute: 0.1 10*3/uL (ref 0.0–0.2)
Basos: 1 %
Bilirubin Total: 0.3 mg/dL (ref 0.0–1.2)
Calcium: 10.1 mg/dL (ref 8.7–10.2)
Chloride: 104 mmol/L (ref 96–106)
Chol/HDL Ratio: 6 ratio — ABNORMAL HIGH (ref 0.0–5.0)
Cholesterol, Total: 240 mg/dL — ABNORMAL HIGH (ref 100–199)
Creatinine, Ser: 1.1 mg/dL (ref 0.76–1.27)
EOS (ABSOLUTE): 0.4 10*3/uL (ref 0.0–0.4)
Eos: 5 %
Estimated CHD Risk: 1.3 times avg. — ABNORMAL HIGH (ref 0.0–1.0)
Free Thyroxine Index: 2 (ref 1.2–4.9)
GFR calc Af Amer: 101 mL/min/{1.73_m2} (ref >59)
GFR calc non Af Amer: 87 mL/min/{1.73_m2} (ref >59)
GGT: 25 IU/L (ref 0–65)
Globulin, Total: 2.6 g/dL (ref 1.5–4.5)
Glucose: 89 mg/dL (ref 65–99)
HDL: 40 mg/dL (ref >39)
Hematocrit: 48.9 % (ref 37.5–51.0)
Hemoglobin: 16.7 g/dL (ref 13.0–17.7)
Immature Grans (Abs): 0 10*3/uL (ref 0.0–0.1)
Immature Granulocytes: 0 %
Iron: 102 ug/dL (ref 38–169)
LDH: 220 IU/L (ref 121–224)
LDL Chol Calc (NIH): 160 mg/dL — ABNORMAL HIGH (ref 0–99)
Lymphocytes Absolute: 2.4 10*3/uL (ref 0.7–3.1)
Lymphs: 26 %
MCH: 29.1 pg (ref 26.6–33.0)
MCHC: 34.2 g/dL (ref 31.5–35.7)
MCV: 85 fL (ref 79–97)
Monocytes Absolute: 0.5 10*3/uL (ref 0.1–0.9)
Monocytes: 6 %
Neutrophils Absolute: 5.7 10*3/uL (ref 1.4–7.0)
Neutrophils: 62 %
Phosphorus: 3.1 mg/dL (ref 2.8–4.1)
Platelets: 258 10*3/uL (ref 150–450)
Potassium: 4.8 mmol/L (ref 3.5–5.2)
RBC: 5.74 x10E6/uL (ref 4.14–5.80)
RDW: 12.7 % (ref 11.6–15.4)
Sodium: 139 mmol/L (ref 134–144)
T3 Uptake Ratio: 24 % (ref 24–39)
T4, Total: 8.3 ug/dL (ref 4.5–12.0)
TSH: 1.23 u[IU]/mL (ref 0.450–4.500)
Total Protein: 7.2 g/dL (ref 6.0–8.5)
Triglycerides: 217 mg/dL — ABNORMAL HIGH (ref 0–149)
Uric Acid: 6.5 mg/dL (ref 3.7–8.6)
VLDL Cholesterol Cal: 40 mg/dL (ref 5–40)
WBC: 9 10*3/uL (ref 3.4–10.8)

## 2018-10-25 LAB — VITAMIN D 25 HYDROXY (VIT D DEFICIENCY, FRACTURES): Vit D, 25-Hydroxy: 19.1 ng/mL — ABNORMAL LOW (ref 30.0–100.0)

## 2018-11-02 ENCOUNTER — Encounter: Payer: Self-pay | Admitting: Nurse Practitioner

## 2018-11-02 ENCOUNTER — Ambulatory Visit: Payer: Self-pay | Admitting: Nurse Practitioner

## 2018-11-02 ENCOUNTER — Other Ambulatory Visit: Payer: Self-pay

## 2018-11-02 VITALS — BP 120/75 | HR 84 | Temp 98.0°F | Resp 16 | Ht 69.0 in | Wt 204.0 lb

## 2018-11-02 DIAGNOSIS — E559 Vitamin D deficiency, unspecified: Secondary | ICD-10-CM

## 2018-11-02 DIAGNOSIS — E6609 Other obesity due to excess calories: Secondary | ICD-10-CM

## 2018-11-02 DIAGNOSIS — Z23 Encounter for immunization: Secondary | ICD-10-CM

## 2018-11-02 DIAGNOSIS — Z6830 Body mass index (BMI) 30.0-30.9, adult: Secondary | ICD-10-CM

## 2018-11-02 DIAGNOSIS — Z0001 Encounter for general adult medical examination with abnormal findings: Secondary | ICD-10-CM

## 2018-11-02 NOTE — Patient Instructions (Addendum)
Antonio Rollins it was meeting you today! Please get an over the counter vitamin d 1000iu capsule and take by mouth daily Work on healthier eating habits and exercise as tolerated. I have attached some education for you to review to help you RTC in 6 mos. Follow up on your cholesterol and vitamin d  Fat and Cholesterol Restricted Eating Plan Getting too much fat and cholesterol in your diet may cause health problems. Choosing the right foods helps keep your fat and cholesterol at normal levels. This can keep you from getting certain diseases. Your doctor may recommend an eating plan that includes:  Total fat: ______% or less of total calories a day.  Saturated fat: ______% or less of total calories a day.  Cholesterol: less than _________mg a day.  Fiber: ______g a day. What are tips for following this plan? Meal planning  At meals, divide your plate into four equal parts: ? Fill one-half of your plate with vegetables and green salads. ? Fill one-fourth of your plate with whole grains. ? Fill one-fourth of your plate with low-fat (lean) protein foods.  Eat fish that is high in omega-3 fats at least two times a week. This includes mackerel, tuna, sardines, and salmon.  Eat foods that are high in fiber, such as whole grains, beans, apples, broccoli, carrots, peas, and barley. General tips    Work with your doctor to lose weight if you need to.  Avoid: ? Foods with added sugar. ? Fried foods. ? Foods with partially hydrogenated oils.  Limit alcohol intake to no more than 1 drink a day for nonpregnant women and 2 drinks a day for men. One drink equals 12 oz of beer, 5 oz of wine, or 1 oz of hard liquor. Reading food labels  Check food labels for: ? Trans fats. ? Partially hydrogenated oils. ? Saturated fat (g) in each serving. ? Cholesterol (mg) in each serving. ? Fiber (g) in each serving.  Choose foods with healthy fats, such as: ? Monounsaturated fats. ? Polyunsaturated  fats. ? Omega-3 fats.  Choose grain products that have whole grains. Look for the word "whole" as the first word in the ingredient list. Cooking  Cook foods using low-fat methods. These include baking, boiling, grilling, and broiling.  Eat more home-cooked foods. Eat at restaurants and buffets less often.  Avoid cooking using saturated fats, such as butter, cream, palm oil, palm kernel oil, and coconut oil. Recommended foods   Fruits  All fresh, canned (in natural juice), or frozen fruits. Vegetables  Fresh or frozen vegetables (raw, steamed, roasted, or grilled). Green salads. Grains  Whole grains, such as whole wheat or whole grain breads, crackers, cereals, and pasta. Unsweetened oatmeal, bulgur, barley, quinoa, or brown rice. Corn or whole wheat flour tortillas. Meats and other protein foods  Ground beef (85% or leaner), grass-fed beef, or beef trimmed of fat. Skinless chicken or [place]. Ground chicken or [place]. Pork trimmed of fat. All fish and seafood. Egg whites. Dried beans, peas, or lentils. Unsalted nuts or seeds. Unsalted canned beans. Nut butters without added sugar or oil. Dairy  Low-fat or nonfat dairy products, such as skim or 1% milk, 2% or reduced-fat cheeses, low-fat and fat-free ricotta or cottage cheese, or plain low-fat and nonfat yogurt. Fats and oils  Tub margarine without trans fats. Light or reduced-fat mayonnaise and salad dressings. Avocado. Olive, canola, sesame, or safflower oils. The items listed above may not be a complete list of foods and beverages you can eat.  Contact a dietitian for more information. Foods to avoid Fruits  Canned fruit in heavy syrup. Fruit in cream or butter sauce. Fried fruit. Vegetables  Vegetables cooked in cheese, cream, or butter sauce. Fried vegetables. Grains  White bread. White pasta. White rice. Cornbread. Bagels, pastries, and croissants. Crackers and snack foods that contain trans fat and hydrogenated  oils. Meats and other protein foods  Fatty cuts of meat. Ribs, chicken wings, bacon, sausage, bologna, salami, chitterlings, fatback, hot dogs, bratwurst, and packaged lunch meats. Liver and organ meats. Whole eggs and egg yolks. Chicken and [place] with skin. Fried meat. Dairy  Whole or 2% milk, cream, half-and-half, and cream cheese. Whole milk cheeses. Whole-fat or sweetened yogurt. Full-fat cheeses. Nondairy creamers and whipped toppings. Processed cheese, cheese spreads, and cheese curds. Beverages  Alcohol. Sugar-sweetened drinks such as sodas, lemonade, and fruit drinks. Fats and oils  Butter, stick margarine, lard, shortening, ghee, or bacon fat. Coconut, palm kernel, and palm oils. Sweets and desserts  Corn syrup, sugars, honey, and molasses. Candy. Jam and jelly. Syrup. Sweetened cereals. Cookies, pies, cakes, donuts, muffins, and ice cream. The items listed above may not be a complete list of foods and beverages you should avoid. Contact a dietitian for more information. Summary  Choosing the right foods helps keep your fat and cholesterol at normal levels. This can keep you from getting certain diseases.  At meals, fill one-half of your plate with vegetables and green salads.  Eat high-fiber foods, like whole grains, beans, apples, carrots, peas, and barley.  Limit added sugar, saturated fats, alcohol, and fried foods. This information is not intended to replace advice given to you by your health care provider. Make sure you discuss any questions you have with your health care provider. Document Released: 07/06/2011 Document Revised: 09/07/2017 Document Reviewed: 09/21/2016 Elsevier Patient Education  2020 Else[employer] Inc.    BMI for Adults   Body mass index (BMI) is a number that is calculated from a person's weight and height. BMI may help to estimate how much of a person's weight is composed of fat. BMI can help identify those who may be at higher risk for certain  medical problems. How is BMI used with adults? BMI is used as a screening tool to identify possible weight problems. It is used to check whether a person is obese, overweight, healthy weight, or underweight. How is BMI calculated? BMI measures your weight and compares it to your height. This can be done either in [place] ([place] or metric measurements. Note that charts are available to help you find your BMI quickly and easily without having to do these calculations yourself. To calculate your BMI in English ([place] measurements, your health care provider will: 1. Measure your weight in pounds (lb). 2. Multiply the number of pounds by 703. ? For example, for a person who weighs 180 lb, multiply that number by 703, which equals 126,540. 3. Measure your height in inches (in). Then multiply that number by itself to get a measurement called "inches squared." ? For example, for a person who is 70 in tall, the "inches squared" measurement is 70 in x 70 in, which equals 4900 inches squared. 4. Divide the total from Step 2 (number of lb x 703) by the total from Step 3 (inches squared): 126,540  4900 = 25.8. This is your BMI. To calculate your BMI in metric measurements, your health care provider will: 1. Measure your weight in kilograms (kg). 2. Measure your height in meters (m). Then  multiply that number by itself to get a measurement called "meters squared." ? For example, for a person who is 1.75 m tall, the "meters squared" measurement is 1.75 m x 1.75 m, which is equal to 3.1 meters squared. 3. Divide the number of kilograms (your weight) by the meters squared number. In this example: 70  3.1 = 22.6. This is your BMI. How is BMI interpreted? To interpret your results, your health care provider will use BMI charts to identify whether you are underweight, normal weight, overweight, or obese. The following guidelines will be used:  Underweight: BMI less than 18.5.  Normal weight: BMI between 18.5 and  24.9.  Overweight: BMI between 25 and 29.9.  Obese: BMI of 30 and above. Please note:  Weight includes both fat and muscle, so someone with a muscular build, such as an athlete, may have a BMI that is higher than 24.9. In cases like these, BMI is not an accurate measure of body fat.  To determine if excess body fat is the cause of a BMI of 25 or higher, further assessments may need to be done by a health care provider.  BMI is usually interpreted in the same way for men and women. Why is BMI a useful tool? BMI is useful in two ways:  Identifying a weight problem that may be related to a medical condition, or that may increase the risk for medical problems.  Promoting lifestyle and diet changes in order to reach a healthy weight. Summary  Body mass index (BMI) is a number that is calculated from a person's weight and height.  BMI may help to estimate how much of a person's weight is composed of fat. BMI can help identify those who may be at higher risk for certain medical problems.  BMI can be measured using English measurements or metric measurements.  To interpret your results, your health care provider will use BMI charts to identify whether you are underweight, normal weight, overweight, or obese. This information is not intended to replace advice given to you by your health care provider. Make sure you discuss any questions you have with your health care provider. Document Released: 09/16/2003 Document Revised: 12/17/2016 Document Reviewed: 11/17/2016 Elsevier Patient Education  2020 [employer].

## 2018-11-02 NOTE — Progress Notes (Signed)
Subjective:    Patient ID: Antonio Rollins, male    DOB: May 07, 1985, 33 y.o.   MRN: [id number]  HPI Antonio Rollins is here today for his CPE.He has no concerns and is followed by Dr. Leane Platt every 6 months for his asthma. No recent flare up. He has a hx of anxiety and reports it resolved when he changed jobs and abruptly stopped about 6 months ago. Does admit had dizziness for about a week "then I was good". He denies any anxious of depressive symptoms and denies SI/HI. He is married in a monogomous relationship with wife and has no STI concerns today or wish to be tested for HIV. He reports he doesn't have good eating habits or exercise. Denies any SOB, ear pain, fever, blood in stool, rashes, chest pain and all other ROS negative. Reports he social distances and adheres to COVID recommendations and hyrates well.   PHQ9:0, GAD7:0  Unsure of last Tdap and agrees to flu/tetanus today.  Eyes: followed by eye dr. Mauricia Area exam 10/2018 and wears contacts.  Denies cardiac FH   Last were reviewed today and discussed with Antonio Rollins.  Review of Systems  Constitutional: Negative for chills, fatigue and fever.  HENT: Negative for ear pain.        Abnormal findings on exam: denies decreased hearing or oltalgia to left ear  Eyes: Negative for photophobia and visual disturbance.  Respiratory: Negative for chest tightness, shortness of breath and wheezing.   Cardiovascular: Negative for chest pain.  Gastrointestinal: Negative for abdominal distention, abdominal pain, blood in stool, constipation, diarrhea, nausea and vomiting.  Genitourinary: Negative.   Allergic/Immunologic: Positive for environmental allergies. Negative for food allergies.  Neurological: Negative for dizziness, light-headedness and headaches.  Psychiatric/Behavioral: Negative for sleep disturbance and suicidal ideas. The patient is not nervous/anxious.        Objective:   Physical Exam Vitals signs reviewed.  Constitutional:    Appearance: Normal appearance. He is obese. He is not ill-appearing.  HENT:     Head: Normocephalic and atraumatic.     Right Ear: Ear canal normal. There is no impacted cerumen.     Left Ear: Ear canal normal. There is no impacted cerumen.     Ears:     Comments: Left TM with clear fluid behind membrane and erythematous    Nose: Nose normal.     Mouth/Throat:     Mouth: Mucous membranes are moist.     Comments: Bilateral 3+ tonsils with cobblestoning and slightly erythematous Eyes:     Extraocular Movements: Extraocular movements intact.     Conjunctiva/sclera: Conjunctivae normal.     Pupils: Pupils are equal, round, and reactive to light.     Comments: Slight nystagmus  Neck:     Musculoskeletal: Normal range of motion and neck supple.     Comments: nontender left upper anterior lymphadenopath Cardiovascular:     Rate and Rhythm: Normal rate and regular rhythm.     Pulses: Normal pulses.     Heart sounds: Normal heart sounds. No murmur.     Comments: 2+ radials/post. tibs Pulmonary:     Effort: Pulmonary effort is normal. No respiratory distress.     Breath sounds: No stridor. No rhonchi or rales.     Comments: Upper posterior inspiratory wheezing noted, not anterior and CTA in all other lung fields Abdominal:     General: Abdomen is flat. Bowel sounds are normal. There is no distension.     Palpations: Abdomen is soft. There  is no mass.     Tenderness: There is no abdominal tenderness.     Hernia: No hernia is present.  Genitourinary:    Comments: deferred Musculoskeletal: Normal range of motion.        General: No swelling or deformity.     Right lower leg: No edema.     Left lower leg: No edema.  Skin:    General: Skin is warm and dry.  Neurological:     General: No focal deficit present.     Mental Status: He is alert and oriented to person, place, and time.  Psychiatric:        Mood and Affect: Mood normal.        Behavior: Behavior normal.        Thought  Content: Thought content normal.        Judgment: Judgment normal.           Assessment & Plan:

## 2019-03-27 DIAGNOSIS — J4520 Mild intermittent asthma, uncomplicated: Secondary | ICD-10-CM | POA: Diagnosis not present

## 2019-03-27 DIAGNOSIS — J310 Chronic rhinitis: Secondary | ICD-10-CM | POA: Diagnosis not present

## 2019-05-09 ENCOUNTER — Other Ambulatory Visit: Payer: Self-pay

## 2019-05-09 ENCOUNTER — Encounter: Payer: Self-pay | Admitting: Nurse Practitioner

## 2019-05-09 ENCOUNTER — Ambulatory Visit: Payer: BC Managed Care – PPO | Admitting: Nurse Practitioner

## 2019-05-09 VITALS — BP 121/73 | HR 84 | Temp 98.1°F | Resp 16 | Ht 69.0 in | Wt 201.0 lb

## 2019-05-09 DIAGNOSIS — E782 Mixed hyperlipidemia: Secondary | ICD-10-CM

## 2019-05-09 DIAGNOSIS — E559 Vitamin D deficiency, unspecified: Secondary | ICD-10-CM

## 2019-05-09 DIAGNOSIS — F411 Generalized anxiety disorder: Secondary | ICD-10-CM

## 2019-05-09 MED ORDER — SERTRALINE HCL 25 MG PO TABS: 25.0000 mg | ORAL_TABLET | Freq: Every day | ORAL | 0 refills | Status: DC

## 2019-05-09 NOTE — Patient Instructions (Addendum)
-Please set your clock to take your medication so that you can remember to take daily to avoid withdrawal symptoms. -Remember to take your vitamin d and healthy eating habits to include low cholesterol/low fat diet -It takes about 4-6 weeks for this medication to reach it's therapeutic level -Aqib if you need your rescue inhaler more than 2 times a day please let Dr. Meredeth Ide know -I've attached some stress reduction technique education for you to assist with your anxious symptoms -We will have a telehealth virtual in 2 weeks; which I will invite you into the telehealth visit and at that time we will discuss your labs and schedule you for your visit  Mindfulness-Based Stress Reduction Mindfulness-based stress reduction (MBSR) is a program that helps people learn to practice mindfulness. Mindfulness is the practice of intentionally paying attention to the present moment. It can be learned and practiced through techniques such as education, breathing exercises, meditation, and yoga. MBSR includes several mindfulness techniques in one program. MBSR works best when you understand the treatment, are willing to try new things, and can commit to spending time practicing what you learn. MBSR training may include learning about:  How your emotions, thoughts, and reactions affect your body.  New ways to respond to things that cause negative thoughts to start (triggers).  How to notice your thoughts and let go of them.  Practicing awareness of everyday things that you normally do without thinking.  The techniques and goals of different types of meditation. What are the benefits of MBSR? MBSR can have many benefits, which include helping you to:  Develop self-awareness. This refers to knowing and understanding yourself.  Learn skills and attitudes that help you to participate in your own health care.  Learn new ways to care for yourself.  Be more accepting about how things are, and let things  go.  Be less judgmental and approach things with an open mind.  Be patient with yourself and trust yourself more. MBSR has also been shown to:  Reduce negative emotions, such as depression and anxiety.  Improve memory and focus.  Change how you sense and approach pain.  Boost your body's ability to fight infections.  Help you connect better with other people.  Improve your sense of well-being. Follow these instructions at home:    Find a local in-person or online MBSR program.  Set aside some time regularly for mindfulness practice.  Find a mindfulness practice that works best for you. This may include one or more of the following: ? Meditation. Meditation involves focusing your mind on a certain thought or activity. ? Breathing awareness exercises. These help you to stay present by focusing on your breath. ? Body scan. For this practice, you lie down and pay attention to each part of your body from head to toe. You can identify tension and soreness and intentionally relax parts of your body. ? Yoga. Yoga involves stretching and breathing, and it can improve your ability to move and be flexible. It can also provide an experience of testing your body's limits, which can help you release stress. ? Mindful eating. This way of eating involves focusing on the taste, texture, color, and smell of each bite of food. Because this slows down eating and helps you feel full sooner, it can be an important part of a weight-loss plan.  Find a podcast or recording that provides guidance for breathing awareness, body scan, or meditation exercises. You can listen to these any time when you have a  free moment to rest without distractions.  Follow your treatment plan as told by your health care provider. This may include taking regular medicines and making changes to your diet or lifestyle as recommended. How to practice mindfulness To do a basic awareness exercise:  Find a comfortable place to  sit.  Pay attention to the present moment. Observe your thoughts, feelings, and surroundings just as they are.  Avoid placing judgment on yourself, your feelings, or your surroundings. Make note of any judgment that comes up, and let it go.  Your mind may wander, and that is okay. Make note of when your thoughts drift, and return your attention to the present moment. To do basic mindfulness meditation:  Find a comfortable place to sit. This may include a stable chair or a firm floor cushion. ? Sit upright with your back straight. Let your arms fall next to your side with your hands resting on your legs. ? If sitting in a chair, rest your feet flat on the floor. ? If sitting on a cushion, cross your legs in front of you.  Keep your head in a neutral position with your chin dropped slightly. Relax your jaw and rest the tip of your tongue on the roof of your mouth. Drop your gaze to the floor. You can close your eyes if you like.  Breathe normally and pay attention to your breath. Feel the air moving in and out of your nose. Feel your belly expanding and relaxing with each breath.  Your mind may wander, and that is okay. Make note of when your thoughts drift, and return your attention to your breath.  Avoid placing judgment on yourself, your feelings, or your surroundings. Make note of any judgment or feelings that come up, let them go, and bring your attention back to your breath.  When you are ready, lift your gaze or open your eyes. Pay attention to how your body feels after the meditation. Where to find more information You can find more information about MBSR from:  Your health care provider.  Community-based meditation centers or programs.  Programs offered near you. Summary  Mindfulness-based stress reduction (MBSR) is a program that teaches you how to intentionally pay attention to the present moment. It is used with other treatments to help you cope better with daily stress,  emotions, and pain.  MBSR focuses on developing self-awareness, which allows you to respond to life stress without judgment or negative emotions.  MBSR programs may involve learning different mindfulness practices, such as breathing exercises, meditation, yoga, body scan, or mindful eating. Find a mindfulness practice that works best for you, and set aside time for it on a regular basis. This information is not intended to replace advice given to you by your health care provider. Make sure you discuss any questions you have with your health care provider. Document Revised: 12/17/2016 Document Reviewed: 05/13/2016 Elsevier Patient Education  [employer].

## 2019-05-09 NOTE — Progress Notes (Addendum)
   Subjective:    Patient ID: Antonio Rollins, male    DOB: [birth year], 34 y.o.   MRN: [id number]  HPI [name] is here today to f/u on Vitamin d, cholesterol and anxiety. He is still followed by asthma and allergist for his asthma and reports that he's using his albuterol twice daily and denies wheezing but more of chest tightness that wasn't occurring when on Zoloft. He reports his doctor is aware of his need to use his rescue inhaler a few times a day and he gets relief when using this. He's been off Zoloft for some time and reports he tolerated well and helped his anxious symptoms but he doesn't like the withdrawal symptoms he had when he forgot to take it. Discussed with patient that this medication is usually weaned and likely to have serotonin withdrawal when missing. Deyon reports he has some fidgeting of the hands and has tried coping skills but worries a lot and is restless. He reports he wakes up every night to check the house and check on the animals, although reports he gets 5-6 hours of sleep a night but gets up 2-3 times. He reports he gets up every night walking around his home to check his home and checks on the animals.    Review of Systems  Constitutional: Negative for fever.  Respiratory: Negative for shortness of breath and wheezing.   Skin: Negative for color change.  Psychiatric/Behavioral: The patient is nervous/anxious.        Objective:   Physical Exam Vitals reviewed.  Constitutional:      Appearance: Normal appearance.  HENT:     Head: Normocephalic and atraumatic.  Cardiovascular:     Rate and Rhythm: Normal rate and regular rhythm.     Heart sounds: Normal heart sounds. No murmur. No gallop.   Pulmonary:     Effort: Pulmonary effort is normal.     Breath sounds: Normal breath sounds. No wheezing.  Abdominal:     General: Abdomen is flat.     Palpations: Abdomen is soft. There is no mass.     Tenderness: There is no abdominal tenderness.   Musculoskeletal:     Cervical back: Normal range of motion and neck supple.  Neurological:     Mental Status: He is alert and oriented to person, place, and time.  Psychiatric:        Behavior: Behavior normal.        Thought Content: Thought content normal.        Judgment: Judgment normal.     Comments: Congruent with affect           Assessment & Plan:

## 2019-05-10 ENCOUNTER — Other Ambulatory Visit: Payer: Self-pay | Admitting: Nurse Practitioner

## 2019-05-10 ENCOUNTER — Ambulatory Visit: Payer: Self-pay | Admitting: Nurse Practitioner

## 2019-05-10 ENCOUNTER — Encounter: Payer: Self-pay | Admitting: Nurse Practitioner

## 2019-05-10 DIAGNOSIS — E782 Mixed hyperlipidemia: Secondary | ICD-10-CM

## 2019-05-10 DIAGNOSIS — E559 Vitamin D deficiency, unspecified: Secondary | ICD-10-CM

## 2019-05-10 LAB — CMP14+EGFR
ALT: 16 IU/L (ref 0–44)
AST: 17 IU/L (ref 0–40)
Albumin/Globulin Ratio: 1.9 (ref 1.2–2.2)
Albumin: 4.6 g/dL (ref 4.0–5.0)
Alkaline Phosphatase: 75 IU/L (ref 39–117)
BUN/Creatinine Ratio: 10 (ref 9–20)
BUN: 14 mg/dL (ref 6–20)
Bilirubin Total: 0.2 mg/dL (ref 0.0–1.2)
CO2: 21 mmol/L (ref 20–29)
Calcium: 9.7 mg/dL (ref 8.7–10.2)
Chloride: 103 mmol/L (ref 96–106)
Creatinine, Ser: 1.37 mg/dL — ABNORMAL HIGH (ref 0.76–1.27)
GFR calc Af Amer: 77 mL/min/{1.73_m2} (ref >59)
GFR calc non Af Amer: 66 mL/min/{1.73_m2} (ref >59)
Globulin, Total: 2.4 g/dL (ref 1.5–4.5)
Glucose: 109 mg/dL — ABNORMAL HIGH (ref 65–99)
Potassium: 4 mmol/L (ref 3.5–5.2)
Sodium: 140 mmol/L (ref 134–144)
Total Protein: 7 g/dL (ref 6.0–8.5)

## 2019-05-10 LAB — SPECIMEN STATUS

## 2019-05-10 LAB — VITAMIN D 25 HYDROXY (VIT D DEFICIENCY, FRACTURES): Vit D, 25-Hydroxy: 21.4 ng/mL — ABNORMAL LOW (ref 30.0–100.0)

## 2019-05-10 LAB — LIPID PANEL WITH LDL/HDL RATIO
Cholesterol, Total: 232 mg/dL — ABNORMAL HIGH (ref 100–199)
HDL: 39 mg/dL — ABNORMAL LOW (ref >39)
LDL Chol Calc (NIH): 132 mg/dL — ABNORMAL HIGH (ref 0–99)
LDL/HDL Ratio: 3.4 ratio (ref 0.0–3.6)
Triglycerides: 339 mg/dL — ABNORMAL HIGH (ref 0–149)
VLDL Cholesterol Cal: 61 mg/dL — ABNORMAL HIGH (ref 5–40)

## 2019-05-10 MED ORDER — VITAMIN D (ERGOCALCIFEROL) 1.25 MG (50000 UNIT) PO CAPS: 50000.0000 [IU] | ORAL_CAPSULE | ORAL | 0 refills | Status: AC

## 2019-05-10 NOTE — Progress Notes (Signed)
Message via mychart regardning medication added and repeat lab. Will also repeat CMP/cholesterol.

## 2019-05-23 ENCOUNTER — Telehealth: Payer: BC Managed Care – PPO | Admitting: Medical

## 2019-05-24 ENCOUNTER — Telehealth: Payer: BC Managed Care – PPO | Admitting: Medical

## 2019-05-24 ENCOUNTER — Other Ambulatory Visit: Payer: Self-pay

## 2019-05-24 ENCOUNTER — Telehealth: Payer: BC Managed Care – PPO | Admitting: Nurse Practitioner

## 2019-05-24 DIAGNOSIS — F411 Generalized anxiety disorder: Secondary | ICD-10-CM

## 2019-05-24 DIAGNOSIS — E782 Mixed hyperlipidemia: Secondary | ICD-10-CM

## 2019-05-24 DIAGNOSIS — E559 Vitamin D deficiency, unspecified: Secondary | ICD-10-CM

## 2019-05-24 MED ORDER — SERTRALINE HCL 50 MG PO TABS: 50.0000 mg | ORAL_TABLET | Freq: Every day | ORAL | 1 refills | Status: DC

## 2019-05-24 NOTE — Patient Instructions (Signed)
Increase Sertraline to 2 tabs daily and I will send in 50mg  tab daily Call the clinic before your next follow up if you have any issues or concerns Remember to keep yourself hydrated I have scheduled you a 3 month lab repeat and will schedule you a physical for 6 months our with labs

## 2019-05-24 NOTE — Progress Notes (Signed)
   Subjective:    Patient ID: Antonio Rollins, male    DOB: Oct 19, 1985, 34 y.o.   MRN: [id number]  HPI Antonio Rollins is on a telehealth f/u with consent to treat today. He was recently restarted on Sertraline and taking as directed. He reports that he's only taking 25 mg of Sertraline because he wanted to wait to speak with provider before increasing dose. He does report that he's noticed a change in his anxious symptoms. GAD7 done and 10. He denies any SI. He is only waking up 2-3 times a week now at night to check the house and the animals compared to nightly. He's getting 6-7 hours of sleep a night. He reports excessive worrying 1-2 days a week. He denies any tolerance issues with the Sertraline and is taking the vitamin d as directed and tolerating. He's is aware of labs via mychart and these were discussed today.     Review of Systems  Psychiatric/Behavioral: Negative for self-injury and suicidal ideas. The patient is nervous/anxious.        Sleep improved       Objective:   Physical Exam Constitutional:      General: He is not in acute distress.    Appearance: Normal appearance. He is not ill-appearing.  HENT:     Head: Normocephalic.  Pulmonary:     Effort: Pulmonary effort is normal.  Skin:    Comments: No evidence of diaphoresis  Neurological:     Mental Status: He is alert and oriented to person, place, and time.  Psychiatric:     Comments: Appears calm and relaxed, good eye contact           Assessment & Plan:

## 2019-07-26 ENCOUNTER — Other Ambulatory Visit: Payer: Self-pay | Admitting: Nurse Practitioner

## 2019-07-26 DIAGNOSIS — E559 Vitamin D deficiency, unspecified: Secondary | ICD-10-CM

## 2019-08-07 ENCOUNTER — Other Ambulatory Visit: Payer: BC Managed Care – PPO

## 2019-08-07 DIAGNOSIS — Z1501 Genetic susceptibility to malignant neoplasm of breast: Secondary | ICD-10-CM | POA: Diagnosis not present

## 2019-08-07 DIAGNOSIS — Z803 Family history of malignant neoplasm of breast: Secondary | ICD-10-CM | POA: Diagnosis not present

## 2019-08-07 DIAGNOSIS — Z01419 Encounter for gynecological examination (general) (routine) without abnormal findings: Secondary | ICD-10-CM | POA: Diagnosis not present

## 2019-08-07 DIAGNOSIS — Z1239 Encounter for other screening for malignant neoplasm of breast: Secondary | ICD-10-CM | POA: Diagnosis not present

## 2019-12-03 ENCOUNTER — Telehealth: Payer: Self-pay | Admitting: Registered Nurse

## 2019-12-03 DIAGNOSIS — F411 Generalized anxiety disorder: Secondary | ICD-10-CM

## 2019-12-03 NOTE — Telephone Encounter (Signed)
Receptionist stated patient contacted clinic requesting refill of sertraline 50mg  po qhs.  Per chart review needs labs and office visit per NP Bartorelli note dated 05/24/2019 labs due august and physical in 6 months.  Per epic labs still pending draw active orders and no appts pending.  Attempted to reach patient via telephone no answer left message.  Receptionist Belinda Day attempting to reach patient to schedule appt and labs also.  Discussed if patient going to run out of medication prior to appt to notify me and I will bridge refill him.

## 2019-12-04 MED ORDER — SERTRALINE HCL 50 MG PO TABS: 50.0000 mg | ORAL_TABLET | Freq: Every day | ORAL | 0 refills | Status: DC

## 2019-12-04 NOTE — Telephone Encounter (Signed)
Patient has labs scheduled for Friday 12/07/2019 and appt with provider first week of December.  Bridge refill electronic Rx sent to his pharmacy of choice sertraline 50mg  po daily #30 RF0.

## 2019-12-07 ENCOUNTER — Other Ambulatory Visit: Payer: BC Managed Care – PPO

## 2019-12-07 ENCOUNTER — Other Ambulatory Visit: Payer: Self-pay

## 2019-12-07 DIAGNOSIS — E559 Vitamin D deficiency, unspecified: Secondary | ICD-10-CM

## 2019-12-07 DIAGNOSIS — E782 Mixed hyperlipidemia: Secondary | ICD-10-CM

## 2019-12-07 DIAGNOSIS — F411 Generalized anxiety disorder: Secondary | ICD-10-CM

## 2019-12-08 LAB — LIPID PANEL
Chol/HDL Ratio: 6.1 ratio — ABNORMAL HIGH (ref 0.0–5.0)
Cholesterol, Total: 244 mg/dL — ABNORMAL HIGH (ref 100–199)
HDL: 40 mg/dL (ref >39)
LDL Chol Calc (NIH): 161 mg/dL — ABNORMAL HIGH (ref 0–99)
Triglycerides: 232 mg/dL — ABNORMAL HIGH (ref 0–149)
VLDL Cholesterol Cal: 43 mg/dL — ABNORMAL HIGH (ref 5–40)

## 2019-12-08 LAB — CMP14+EGFR
ALT: 22 IU/L (ref 0–44)
AST: 17 IU/L (ref 0–40)
Albumin/Globulin Ratio: 2 (ref 1.2–2.2)
Albumin: 4.6 g/dL (ref 4.0–5.0)
Alkaline Phosphatase: 74 IU/L (ref 44–121)
BUN/Creatinine Ratio: 12 (ref 9–20)
BUN: 14 mg/dL (ref 6–20)
Bilirubin Total: 0.3 mg/dL (ref 0.0–1.2)
CO2: 23 mmol/L (ref 20–29)
Calcium: 10 mg/dL (ref 8.7–10.2)
Chloride: 102 mmol/L (ref 96–106)
Creatinine, Ser: 1.14 mg/dL (ref 0.76–1.27)
GFR calc Af Amer: 96 mL/min/{1.73_m2} (ref >59)
GFR calc non Af Amer: 83 mL/min/{1.73_m2} (ref >59)
Globulin, Total: 2.3 g/dL (ref 1.5–4.5)
Glucose: 91 mg/dL (ref 65–99)
Potassium: 4.8 mmol/L (ref 3.5–5.2)
Sodium: 139 mmol/L (ref 134–144)
Total Protein: 6.9 g/dL (ref 6.0–8.5)

## 2019-12-08 LAB — VITAMIN D 25 HYDROXY (VIT D DEFICIENCY, FRACTURES): Vit D, 25-Hydroxy: 23.5 ng/mL — ABNORMAL LOW (ref 30.0–100.0)

## 2019-12-10 ENCOUNTER — Telehealth: Payer: Self-pay | Admitting: Registered Nurse

## 2019-12-10 DIAGNOSIS — R7989 Other specified abnormal findings of blood chemistry: Secondary | ICD-10-CM

## 2019-12-10 DIAGNOSIS — E785 Hyperlipidemia, unspecified: Secondary | ICD-10-CM

## 2019-12-12 NOTE — Telephone Encounter (Signed)
Telephone message left for patient.  Lab results in mychart.  Clinic closing at noon today for holiday weekend Thur-Sun if he wants to discuss results via telephone no urgent results/big changes from his previous tests.  NP Viviano Simas will review all results at his appt 20 Dec 2019 also.

## 2019-12-20 ENCOUNTER — Encounter: Payer: Self-pay | Admitting: Nurse Practitioner

## 2019-12-20 ENCOUNTER — Other Ambulatory Visit: Payer: Self-pay

## 2019-12-20 ENCOUNTER — Ambulatory Visit: Payer: BC Managed Care – PPO | Admitting: Nurse Practitioner

## 2019-12-20 VITALS — BP 108/72 | HR 90 | Temp 98.2°F | Resp 16

## 2019-12-20 DIAGNOSIS — F411 Generalized anxiety disorder: Secondary | ICD-10-CM

## 2019-12-20 DIAGNOSIS — R0683 Snoring: Secondary | ICD-10-CM

## 2019-12-20 MED ORDER — SERTRALINE HCL 50 MG PO TABS: 50.0000 mg | ORAL_TABLET | Freq: Every day | ORAL | 6 refills | Status: DC

## 2019-12-20 NOTE — Progress Notes (Signed)
Subjective:    Patient ID: Antonio Rollins, male    DOB: Jun 26, 1985, 34 y.o.   MRN: [id number]  HPI  34 year old male here for annual check up today. Has been taking Zoloft 50 mg since May feel anxiety is well controlled. GAD-7 score 9= mild anxiety, PHQ-9 score 2 mostly difficulty with sleep. He has not tried a sleep aid in the past.   Does note that his wife has noted his snoring has increased. He does not always feel rested, but also attributes lack of sleep to anxiety. His anxiety is rooted in safety issues mainly, he will awaken during the night to check and see if his house is locked. He has seen counseling in the past but is not actively seeing a counselor.   Father has sleep apnea, was recently started on CPAP.  Patient is interested in sleep study to evaluate snoring.   Patient has been taking a Vitamin D3 supplement OTC unsure of how much.  Uses Singulair, pulmicort and as needed proair for asthma control. Is followed by Pulmonology who he see biannually for check ups. Well controlled   Today's Vitals   12/20/19 1001  BP: 108/72  Pulse: 90  Resp: 16  Temp: 98.2 F (36.8 C)  TempSrc: Oral  SpO2: 96%   There is no height or weight on file to calculate BMI.   Current Outpatient Medications  Medication Instructions  . albuterol (PROAIR HFA) 108 (90 Base) MCG/ACT inhaler Inhalation  . budesonide (PULMICORT) 0.5 MG/2ML nebulizer solution USE 2ML VIA NEB ONCE DAILY  . montelukast (SINGULAIR) 10 mg, Oral, Daily at bedtime  . ProAir HFA 0.5 mg, Inhalation, As needed  . sertraline (ZOLOFT) 50 mg, Oral, Daily    Past Medical History:  Diagnosis Date  . Asthma    Review of Systems  HENT: Positive for congestion.   Eyes: Negative.   Respiratory: Negative.   Cardiovascular: Negative.   Gastrointestinal: Negative.   Endocrine: Negative.   Genitourinary: Negative.   Skin: Negative.   Neurological: Negative.   Psychiatric/Behavioral: The patient is nervous/anxious.    Snoring       Objective:   Physical Exam Constitutional:      Appearance: Normal appearance.  HENT:     Head: Normocephalic.     Right Ear: Tympanic membrane, ear canal and external ear normal.     Left Ear: Tympanic membrane, ear canal and external ear normal.     Nose:     Right Turbinates: Enlarged.     Left Turbinates: Enlarged.     Comments: Nasal passages narrow     Mouth/Throat:     Mouth: Mucous membranes are moist.  Eyes:     Extraocular Movements: Extraocular movements intact.     Pupils: Pupils are equal, round, and reactive to light.  Cardiovascular:     Rate and Rhythm: Normal rate and regular rhythm.     Heart sounds: Normal heart sounds.  Pulmonary:     Effort: Pulmonary effort is normal.     Breath sounds: Normal breath sounds.  Abdominal:     Palpations: Abdomen is soft.  Musculoskeletal:     Cervical back: Normal range of motion.  Skin:    General: Skin is warm and dry.  Neurological:     General: No focal deficit present.     Mental Status: He is alert.  Psychiatric:        Mood and Affect: Mood normal.  Behavior: Behavior normal.        Thought Content: Thought content normal.        Judgment: Judgment normal.     Recent Results (from the past 2160 hour(s))  CMP14+EGFR     Status: None   Collection Time: 12/07/19  8:15 AM  Result Value Ref Range   Glucose 91 65 - 99 mg/dL   BUN 14 6 - 20 mg/dL   Creatinine, Ser 1.14 0.76 - 1.27 mg/dL   GFR calc non Af Amer 83 >59 mL/min/1.73   GFR calc Af Amer 96 >59 mL/min/1.73    Comment: **In accordance with recommendations from the NKF-ASN Task force,**   [facility] is in the process of updating its eGFR calculation to the   2021 CKD-EPI creatinine equation that estimates kidney function   without a race variable.    BUN/Creatinine Ratio 12 9 - 20   Sodium 139 134 - 144 mmol/L   Potassium 4.8 3.5 - 5.2 mmol/L   Chloride 102 96 - 106 mmol/L   CO2 23 20 - 29 mmol/L   Calcium 10.0 8.7 - 10.2  mg/dL   Total Protein 6.9 6.0 - 8.5 g/dL   Albumin 4.6 4.0 - 5.0 g/dL   Globulin, Total 2.3 1.5 - 4.5 g/dL   Albumin/Globulin Ratio 2.0 1.2 - 2.2   Bilirubin Total 0.3 0.0 - 1.2 mg/dL   Alkaline Phosphatase 74 44 - 121 IU/L    Comment:               **Please note reference interval change**   AST 17 0 - 40 IU/L   ALT 22 0 - 44 IU/L  Lipid panel     Status: Abnormal   Collection Time: 12/07/19  8:15 AM  Result Value Ref Range   Cholesterol, Total 244 (H) 100 - 199 mg/dL   Triglycerides 232 (H) 0 - 149 mg/dL   HDL 40 >39 mg/dL   VLDL Cholesterol Cal 43 (H) 5 - 40 mg/dL   LDL Chol Calc (NIH) 161 (H) 0 - 99 mg/dL   Chol/HDL Ratio 6.1 (H) 0.0 - 5.0 ratio    Comment:                                   T. Chol/HDL Ratio                                             Men  Women                               1/2 Avg.Risk  3.4    3.3                                   Avg.Risk  5.0    4.4                                2X Avg.Risk  9.6    7.1  3X Avg.Risk 23.4   11.0   VITAMIN D 25 Hydroxy (Vit-D Deficiency, Fractures)     Status: Abnormal   Collection Time: 12/07/19  8:15 AM  Result Value Ref Range   Vit D, 25-Hydroxy 23.5 (L) 30.0 - 100.0 ng/mL    Comment: Vitamin D deficiency has been defined by the [place] practice guideline as a level of serum 25-OH vitamin D less than 20 ng/mL (1,2). The Endocrine Society went on to further define vitamin D insufficiency as a level between 21 and 29 ng/mL (2). 1. IOM (Institute of Medicine). 2010. Dietary reference    intakes for calcium and D. [place]: The    [employer]. 2. Holick MF, Binkley [place], Bischoff-Ferrari HA, et al.    Evaluation, treatment, and prevention of vitamin D    deficiency: an Endocrine Society clinical practice    guideline. JCEM. 2011 Jul; 96(7):1911-30.     Total Cholesterol increased to 244, triglyceride decrease to 232, HDL at 40, LDL  increased to 161. Glucose WNL, Vitamin D remains low at 21.4.  CV 10 year risk factor 1%    Assessment & Plan:  Patient will check on Vitamin D3 supplemental dose and follow up with provider for advice on increasing daily supplement.  Discussed ways of lowering cholesterol, avoiding fried foods, avoid eating fast food, choosing lean meats, starting OTC Omega3 or Fish Oil supplement.   Advised 30 minutes of cardiovascular exercise daily.   Discussed starting counseling services and provided patient with phone number for [facility] through [facility]. Discussed importance of counseling in conjunction with anxiety medication. Also discussed possibly using hydroxyzine for sleep aid- patient will research this medication and consider use- will contact provider if he would like to trial it  Ordered sleep study, will follow up with results. Discussed potential referral to ENT for evaluation of snoring as well. He will consider options after results of sleep study.   RTC in one year for labs and physical otherwise. Earlier with any changes in current conditions or with new concerns.   Meds ordered this encounter  Medications  . sertraline (ZOLOFT) 50 MG tablet    Sig: Take 1 tablet (50 mg total) by mouth daily.    Dispense:  30 tablet    Refill:  6

## 2019-12-21 NOTE — Telephone Encounter (Signed)
Patient saw NP Jimmey Ralph 12/20/2019 labs discussed.

## 2020-01-01 ENCOUNTER — Telehealth: Payer: Self-pay

## 2020-01-01 NOTE — Telephone Encounter (Signed)
Pt called the clinic yesterday  Around 1045 with questions regarding his referral to the [facility] clinic for a home sleep study; I called and left a voicemail for [facility] at the clinic and did not hear back; pt had not heard back either; called Ladona Ridgel at the sleep clinic again today and she stated she does not schedule the home sleep studies but gave me the number of the place that does-[facility] @ [phone]; pt called and given this information and per Ladona Ridgel, he can call [facility] and schedule his home sleep study; pt verb u/o and will call; pt appreciative of phone call

## 2020-01-23 ENCOUNTER — Telehealth: Payer: Self-pay

## 2020-01-23 NOTE — Telephone Encounter (Signed)
Pt called stating he had not heard back about his sleep study appointment; he called several weeks ago and has not heard back; encouraged him to call [facility] at [phone] and follow up with them; he verb u/o and stated he would call.

## 2020-01-28 DIAGNOSIS — G4733 Obstructive sleep apnea (adult) (pediatric): Secondary | ICD-10-CM | POA: Diagnosis not present

## 2020-01-28 DIAGNOSIS — R0683 Snoring: Secondary | ICD-10-CM | POA: Diagnosis not present

## 2020-02-15 ENCOUNTER — Telehealth: Payer: BC Managed Care – PPO | Admitting: Nurse Practitioner

## 2020-02-15 ENCOUNTER — Other Ambulatory Visit: Payer: Self-pay

## 2020-02-15 NOTE — Progress Notes (Signed)
Spoke with patient on the phone to review sleep study.  Evidence of mild obstructive sleep apnea, 12 episodes of desaturation up to 4%.  (see scanned report)  Recommendation is evaluation for PAP therapy or lifestyle modifications to decrease risk of sleep apnea: Sleep hygiene, weight loss, avoiding sleeping in supine position.   Patient's current BMI is 29, he has initiated exercising more consistently since physical in December. Has started Fish oil supplements as discussed due to elevated cholesterol.  Patient is interested in initiating lifestyle modifications, will schedule with [facility] Registered dietician for consultation on dietary changes in promotion of safe weight loss and correction of hyperlipidemia.   Will fax results of sleep study to patient's pulmonologist for review, if PAP is recommended at a more urgent level by pulmonologist will initiate review by insurance for approval of device at that time.  Will otherwise re evaluate patient in 3 months from time of physical which will be March 2022 for BMI and other risk factors associated with sleep apnea and will continue discussion of plan at that time.   Patient will otherwise RTC earlier with any new or worsening symptoms.

## 2020-03-11 ENCOUNTER — Other Ambulatory Visit: Payer: Self-pay

## 2020-03-11 ENCOUNTER — Ambulatory Visit: Payer: BC Managed Care – PPO

## 2020-03-11 NOTE — Progress Notes (Signed)
Nutrition: 03/11/2018  CC: I want to stop snoring so loud and so much when I sleep.  I don't want to have to use a machine.  I need to lose weight."  Assessment: Gives h/x of sleep apnea.  Currently is trying to lose weight to prevent using sleep apnea machine.  Has an allergy to cats, and currently has 2 indoor cats.  Takeng Albuterol, Pulmacort, and ProAir, daily.  Does not want to consider allergy shots.  HT: 5'9' WT:201 lb  Has steadily gained weight since college.  Felt his best with weight in 180's. Has a large body frame. Currently working in the [employer] at [employer].  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 In the evenings may do som set-ups or push-ups. May also work with his son practicing soccer. Weekends, he may assist with soccer practice.  Tries to get in some hiking  Has daily work commute of 60 minutes each way.  Nutrient Intake; 5:30 Bowl of cereal ([place] Toast Crunch) with skim milk. OR frozen sausage biscuit, or 2 slices of toast with PB and jelly. Drinks [employer]. 9:30 May have snack of sausage, egg, cheese, biscuit from [employer] or 2 slices of chocolate chip bread . 12:30 [employer]: grilled fish, broccoli, rice, salad with balsamic dressing. Had a regular Coke at lunch.   At work he is trying to avoid piazza. 6-6:30 Dinner of mac/cheese and chicken legs or dinner of broccoli/cauliflower, pasta marina, and a baked sweet potato. After dinner snack: Cheez[employer] Crackers. Drinks water throughout the day.  Drinks the Coke products for the caffeine.  Does not like coffee.  Recommendations: Keep up the sit-ups and push-ups in the evenings. Keep up and maybe increase the weekend hiking if possible. Use fruit/cheese for an AM snack. Try the country ham biscuit, it has less calories. Or use just the egg, or just the sausage, or just the cheese rather than all 3 items. Look at food labels.  Aim for no more than 0-9 gm of  sugar per serving. Aim for at least 3 gm of fiber per serving where fiber is appropriate. Consider whole grain pasta.  If not make it al'dente and decrease the serving. Since you love meat, remember your serving portion is about 4-5 oz at the most.  Use Coke Zero instead of regular Coke Cola. Add non-starchyvegetables and/or beans and decrease past serving sizes. Continue to be active on weekends. Make evening snack of cheese and a few crackers. Bring your own sausage egg biscuit to work for snack.    Follow-up: Check in with Maggie in 8 weeks. Have Belinda weigh you in 3, 6, and 8 weeks.  Teaching Materials, Foods/Food Groups handout

## 2020-03-13 DIAGNOSIS — R0600 Dyspnea, unspecified: Secondary | ICD-10-CM | POA: Diagnosis not present

## 2020-03-13 DIAGNOSIS — J4541 Moderate persistent asthma with (acute) exacerbation: Secondary | ICD-10-CM | POA: Diagnosis not present

## 2020-07-08 ENCOUNTER — Other Ambulatory Visit: Payer: Self-pay

## 2020-07-08 ENCOUNTER — Encounter: Payer: Self-pay | Admitting: Medical

## 2020-07-08 ENCOUNTER — Ambulatory Visit: Payer: BC Managed Care – PPO | Admitting: Medical

## 2020-07-08 VITALS — BP 124/88 | HR 82 | Temp 98.4°F | Resp 16

## 2020-07-08 DIAGNOSIS — S61211A Laceration without foreign body of left index finger without damage to nail, initial encounter: Secondary | ICD-10-CM

## 2020-07-08 MED ORDER — AMOXICILLIN-POT CLAVULANATE 875-125 MG PO TABS: 1.0000 | ORAL_TABLET | Freq: Two times a day (BID) | ORAL | 0 refills | Status: DC

## 2020-07-08 NOTE — Progress Notes (Signed)
   Subjective:    Patient ID: Antonio Rollins, male    DOB: 1985/12/14, 35 y.o.   MRN: [id number]  HPI 35 yo male in non acute distress, presents to day with a laceration on left index finger on the lateral side proximal to the PIP joint.  He cleaned the wound, the used alcohol and then used Super glue to try to close the wound.  He removed the bandages this morning which also pulled the super glue away.  He cut it at  11pm last ngiht.  His Tetanus is up to date.  Blood pressure 124/88, pulse 82, temperature 98.4 F (36.9 C), temperature source Tympanic, resp. rate 16, SpO2 96 %.  Allergies  Allergen Reactions   Other Other (See Comments)    Uncoded Allergy. Allergen: CATS     Review of Systems  A little tender to palpation.    Objective:   Physical Exam Vitals and nursing note reviewed.  Constitutional:      Appearance: Normal appearance.  HENT:     Head: Normocephalic and atraumatic.  Eyes:     Extraocular Movements: Extraocular movements intact.     Conjunctiva/sclera: Conjunctivae normal.     Pupils: Pupils are equal, round, and reactive to light.  Pulmonary:     Effort: Pulmonary effort is normal.  Musculoskeletal:        General: Normal range of motion.     Cervical back: Normal range of motion.  Skin:    General: Skin is warm and dry.  Neurological:     General: No focal deficit present.     Mental Status: He is alert and oriented to person, place, and time. Mental status is at baseline.  Psychiatric:        Mood and Affect: Mood normal.        Behavior: Behavior normal.        Thought Content: Thought content normal.        Judgment: Judgment normal.    Index finger left side. FROM, mildly swollen.No drainage, no bleeding. It is deep enough that it does show a small area of subcutanious fat.   Marland Kitchen Approximately  1 inch long. Assessment & Plan:  Laceration Left index. Area cleaned with saline and peroxide, neosporin placed on the site and a   bulky  gauze  placed on wound  and wrapped with Coban. Reviewed wound care and s/s of infection with patient. Return to clinic as needed. Showed patient how to do dressing. Meds ordered this encounter  Medications   amoxicillin-clavulanate (AUGMENTIN) 875-125 MG tablet    Sig: Take 1 tablet by mouth 2 (two) times daily. Take with food    Dispense:  14 tablet    Refill:  0   . Recommended he not use Super glue but to use OTC Skin glue. Patient verbalizes understanding and has not questions at discharge./

## 2020-08-07 DIAGNOSIS — Z1501 Genetic susceptibility to malignant neoplasm of breast: Secondary | ICD-10-CM | POA: Diagnosis not present

## 2020-08-07 DIAGNOSIS — Z9189 Other specified personal risk factors, not elsewhere classified: Secondary | ICD-10-CM | POA: Diagnosis not present

## 2020-08-07 DIAGNOSIS — Z01419 Encounter for gynecological examination (general) (routine) without abnormal findings: Secondary | ICD-10-CM | POA: Diagnosis not present

## 2020-08-07 DIAGNOSIS — Z1239 Encounter for other screening for malignant neoplasm of breast: Secondary | ICD-10-CM | POA: Diagnosis not present

## 2020-08-29 ENCOUNTER — Other Ambulatory Visit: Payer: Self-pay | Admitting: Nurse Practitioner

## 2020-08-29 DIAGNOSIS — F411 Generalized anxiety disorder: Secondary | ICD-10-CM

## 2021-05-08 DIAGNOSIS — J310 Chronic rhinitis: Secondary | ICD-10-CM | POA: Diagnosis not present

## 2021-05-08 DIAGNOSIS — J4520 Mild intermittent asthma, uncomplicated: Secondary | ICD-10-CM | POA: Diagnosis not present

## 2021-09-12 ENCOUNTER — Other Ambulatory Visit: Payer: Self-pay | Admitting: Nurse Practitioner

## 2021-09-12 DIAGNOSIS — F411 Generalized anxiety disorder: Secondary | ICD-10-CM

## 2021-10-08 DIAGNOSIS — Z01419 Encounter for gynecological examination (general) (routine) without abnormal findings: Secondary | ICD-10-CM | POA: Diagnosis not present

## 2021-12-23 DIAGNOSIS — N6489 Other specified disorders of breast: Secondary | ICD-10-CM | POA: Diagnosis not present

## 2021-12-30 ENCOUNTER — Encounter: Payer: Self-pay | Admitting: Nurse Practitioner

## 2022-01-06 DIAGNOSIS — N6489 Other specified disorders of breast: Secondary | ICD-10-CM | POA: Diagnosis not present

## 2022-01-12 DIAGNOSIS — N6489 Other specified disorders of breast: Secondary | ICD-10-CM | POA: Diagnosis not present

## 2022-04-19 DIAGNOSIS — Z09 Encounter for follow-up examination after completed treatment for conditions other than malignant neoplasm: Secondary | ICD-10-CM | POA: Diagnosis not present

## 2022-04-19 DIAGNOSIS — N6489 Other specified disorders of breast: Secondary | ICD-10-CM | POA: Diagnosis not present

## 2022-06-15 DIAGNOSIS — J4520 Mild intermittent asthma, uncomplicated: Secondary | ICD-10-CM | POA: Diagnosis not present

## 2022-06-15 DIAGNOSIS — R0602 Shortness of breath: Secondary | ICD-10-CM | POA: Diagnosis not present

## 2022-07-28 DIAGNOSIS — N6310 Unspecified lump in the right breast, unspecified quadrant: Secondary | ICD-10-CM | POA: Diagnosis not present

## 2022-08-07 DIAGNOSIS — H01009 Unspecified blepharitis unspecified eye, unspecified eyelid: Secondary | ICD-10-CM | POA: Diagnosis not present

## 2022-08-07 DIAGNOSIS — H5789 Other specified disorders of eye and adnexa: Secondary | ICD-10-CM | POA: Diagnosis not present

## 2022-11-24 ENCOUNTER — Ambulatory Visit: Payer: BC Managed Care – PPO | Admitting: Nurse Practitioner

## 2022-11-24 ENCOUNTER — Encounter: Payer: Self-pay | Admitting: Nurse Practitioner

## 2022-11-24 VITALS — BP 118/78 | HR 93 | Temp 98.0°F | Ht 69.0 in | Wt 191.0 lb

## 2022-11-24 DIAGNOSIS — F411 Generalized anxiety disorder: Secondary | ICD-10-CM

## 2022-11-24 DIAGNOSIS — Z0000 Encounter for general adult medical examination without abnormal findings: Secondary | ICD-10-CM | POA: Insufficient documentation

## 2022-11-24 DIAGNOSIS — Z0001 Encounter for general adult medical examination with abnormal findings: Secondary | ICD-10-CM | POA: Diagnosis not present

## 2022-11-24 DIAGNOSIS — M25511 Pain in right shoulder: Secondary | ICD-10-CM

## 2022-11-24 DIAGNOSIS — E559 Vitamin D deficiency, unspecified: Secondary | ICD-10-CM | POA: Insufficient documentation

## 2022-11-24 DIAGNOSIS — E782 Mixed hyperlipidemia: Secondary | ICD-10-CM

## 2022-11-24 DIAGNOSIS — J4520 Mild intermittent asthma, uncomplicated: Secondary | ICD-10-CM

## 2022-11-24 NOTE — Assessment & Plan Note (Signed)
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 We will continue his current management and consider an orthopedic evaluation if it worsens.

## 2022-11-24 NOTE — Assessment & Plan Note (Signed)
He was previously on Zoloft but discontinued it due to the loss of his primary care provider. He reports his symptoms are well managed with coping mechanisms and expresses no desire to restart medication. We will continue his current management strategies.

## 2022-11-24 NOTE — Progress Notes (Signed)
Bethanie Dicker, NP-C Phone: [phone]  Antonio Rollins is a 37 y.o. male who presents today to establish care and for annual exam.   Discussed the use of AI scribe software for clinical note transcription with the patient, who gave verbal consent to proceed.  History of Present Illness   The patient, with a known history of asthma, presents for a routine check-up. He is currently on Symbicort, Singulair, and Ventolin, and reports infrequent use of albuterol, indicating well-controlled asthma. He has a history of anxiety, previously managed with Zoloft, but has been off the medication due to loss of primary care. He reports successful management of anxiety symptoms through behavioral modifications and coping mechanisms.  The patient also has a history of vitamin D deficiency, managed with daily multivitamin and vitamin D supplementation. He has a known elevated cholesterol level, which is currently being managed with diet and exercise. The patient reports significant weight loss and maintains a balanced diet with intermittent fasting. He also engages in regular exercise, including walking and light lifting exercises.  The patient has a history of ADHD and is considering re-evaluation for potential medication management. He reports occasional headaches, managed with over-the-counter analgesics. He also reports a recent self-inflicted shoulder injury due to a lifting incident, which is improving with ibuprofen and topical treatments.  The patient has a family history of prostate cancer on the paternal side. He is a former smoker, consumes alcohol occasionally, and reports infrequent use of marijuana. He is sexually active and reports no issues with urinary function. He has regular eye examinations but does not currently see a dentist.      Active Ambulatory Problems    Diagnosis Date Noted   Mild intermittent asthma without complication 10/24/2013   Anxiety, generalized 08/15/2016   Allergic  rhinitis 08/26/2016   Mixed hyperlipidemia 08/27/2016   Vitamin D deficiency 11/24/2022   Preventative health care 11/24/2022   Acute pain of right shoulder 11/24/2022   Resolved Ambulatory Problems    Diagnosis Date Noted   Asthma 07/04/2016   No Additional Past Medical History    Family History  Problem Relation Age of Onset   Anxiety disorder Father    Diabetes Mellitus II Maternal Uncle    Breast cancer Maternal Grandmother    Liver cancer Maternal Grandfather    Prostate cancer Paternal Grandfather     Social History   Socioeconomic History   Marital status: Married    Spouse name: Not on file   Number of children: Not on file   Years of education: Not on file   Highest education level: Not on file  Occupational History   Not on file  Tobacco Use   Smoking status: Former    Current packs/day: 0.00    Types: Cigarettes    Quit date: 02/10/2012    Years since quitting: 10.7   Smokeless tobacco: Current    Last attempt to quit: 02/10/2016   Tobacco comments:    Patient reports that one can of dip lasts him about 2 weeks. He's been using dip for approximately 8 years.  Substance and Sexual Activity   Alcohol use: Yes    Alcohol/week: 2.0 standard drinks of alcohol    Types: 2 Shots of liquor per week   Drug use: No   Sexual activity: Not on file  Other Topics Concern   Not on file  Social History Narrative   Not on file   Social Determinants of Health   Financial Resource Strain: Not on file  Food Insecurity: Not on file  Transportation Needs: Not on file  Physical Activity: Not on file  Stress: Not on file  Social Connections: Not on file  Intimate Partner Violence: Not on file    ROS  General:  Negative for unexplained weight loss, fever Skin: Negative for new or changing mole, sore that won't heal HEENT: Negative for trouble hearing, trouble seeing, ringing in ears, mouth sores, hoarseness, change in voice, dysphagia. CV:  Negative for chest  pain, dyspnea, edema, palpitations Resp: Negative for cough, dyspnea, hemoptysis GI: Negative for nausea, vomiting, diarrhea, constipation, abdominal pain, melena, hematochezia. GU: Negative for dysuria, incontinence, urinary hesitance, hematuria, vaginal or penile discharge, polyuria, sexual difficulty, lumps in testicle or breasts MSK: Negative for muscle cramps or aches, swelling Neuro: Negative for headaches, weakness, numbness, dizziness, passing out/fainting Psych: Negative for depression, anxiety, memory problems  Objective  Physical Exam Vitals:   11/24/22 1338  BP: 118/78  Pulse: 93  Temp: 98 F (36.7 C)  SpO2: 95%    BP Readings from Last 3 Encounters:  11/24/22 118/78  07/08/20 124/88  12/20/19 108/72   Wt Readings from Last 3 Encounters:  11/24/22 191 lb (86.6 kg)  05/09/19 201 lb (91.2 kg)  11/02/18 204 lb (92.5 kg)    Physical Exam Constitutional:      General: He is not in acute distress.    Appearance: Normal appearance.  HENT:     Head: Normocephalic.     Right Ear: Tympanic membrane normal.     Left Ear: Tympanic membrane normal.     Nose: Nose normal.     Mouth/Throat:     Mouth: Mucous membranes are moist.     Pharynx: Oropharynx is clear.  Eyes:     Conjunctiva/sclera: Conjunctivae normal.     Pupils: Pupils are equal, round, and reactive to light.  Neck:     Thyroid: No thyromegaly.  Cardiovascular:     Rate and Rhythm: Normal rate and regular rhythm.     Heart sounds: Normal heart sounds.  Pulmonary:     Effort: Pulmonary effort is normal.     Breath sounds: Examination of the right-lower field reveals wheezing. Wheezing (mild) present.  Abdominal:     General: Abdomen is flat. Bowel sounds are normal.     Palpations: Abdomen is soft. There is no mass.     Tenderness: There is no abdominal tenderness.  Musculoskeletal:        General: Normal range of motion.  Lymphadenopathy:     Cervical: No cervical adenopathy.  Skin:    General:  Skin is warm and dry.     Findings: No rash.  Neurological:     General: No focal deficit present.     Mental Status: He is alert.  Psychiatric:        Mood and Affect: Mood normal.        Behavior: Behavior normal.    Assessment/Plan:   Preventative health care Assessment & Plan: Physical exam complete. He is planning to complete lab work through his job. He will send the results through MyChart once he has them. He plans to get a flu shot with his children, is up to date on tetanus and has received 3 COVID vaccines, declines additional. He will contact an attention specialist for evaluation of ADHD. Recommended establishing with a dentist and following up with his ophthalmologist for annual exams. He will continue his diet and exercise regimen with an annual follow-up unless issues arise.  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 We will continue his current management and consider an orthopedic evaluation if it worsens.   Mixed hyperlipidemia Assessment & Plan: His hyperlipidemia is diet-controlled, and he reports recent weight loss and a family history of hyperlipidemia. Encouraged to continue healthy diet and exercise. He is planning to complete lab work with his job, advised fasting lipid panel. He will send the results once completed.    Anxiety, generalized Assessment & Plan: He was previously on Zoloft but discontinued it due to the loss of his primary care provider. He reports his symptoms are well managed with coping mechanisms and expresses no desire to restart medication. We will continue his current management strategies.   Mild intermittent asthma without complication Assessment & Plan: His asthma is well controlled with Symbicort, Singulair, and Ventolin, rarely requiring albuterol and he follows up with pulmonology annually. We will continue  his current medications.   Vitamin D deficiency Assessment & Plan: He is taking a daily multivitamin and vitamin D supplement. We will continue his current supplementation.     Return in about 1 year (around 11/24/2023) for Annual Exam, sooner PRN.   Bethanie Dicker, NP-C [facility] - [employer]

## 2022-11-24 NOTE — Assessment & Plan Note (Addendum)
His hyperlipidemia is diet-controlled, and he reports recent weight loss and a family history of hyperlipidemia. Encouraged to continue healthy diet and exercise. He is planning to complete lab work with his job, advised fasting lipid panel. He will send the results once completed.

## 2022-11-24 NOTE — Assessment & Plan Note (Signed)
He is taking a daily multivitamin and vitamin D supplement. We will continue his current supplementation.

## 2022-11-24 NOTE — Assessment & Plan Note (Signed)
His asthma is well controlled with Symbicort, Singulair, and Ventolin, rarely requiring albuterol and he follows up with pulmonology annually. We will continue his current medications.

## 2022-11-24 NOTE — Assessment & Plan Note (Addendum)
Physical exam complete. He is planning to complete lab work through his job. He will send the results through MyChart once he has them. He plans to get a flu shot with his children, is up to date on tetanus and has received 3 COVID vaccines, declines additional. He will contact an attention specialist for evaluation of ADHD. Recommended establishing with a dentist and following up with his ophthalmologist for annual exams. He will continue his diet and exercise regimen with an annual follow-up unless issues arise.

## 2023-01-31 DIAGNOSIS — N6489 Other specified disorders of breast: Secondary | ICD-10-CM | POA: Diagnosis not present

## 2023-06-22 DIAGNOSIS — N63 Unspecified lump in unspecified breast: Secondary | ICD-10-CM | POA: Diagnosis not present

## 2023-06-22 DIAGNOSIS — N6489 Other specified disorders of breast: Secondary | ICD-10-CM | POA: Diagnosis not present

## 2023-08-25 DIAGNOSIS — R0602 Shortness of breath: Secondary | ICD-10-CM | POA: Diagnosis not present

## 2023-08-25 DIAGNOSIS — J4520 Mild intermittent asthma, uncomplicated: Secondary | ICD-10-CM | POA: Diagnosis not present

## 2023-11-30 DIAGNOSIS — N6489 Other specified disorders of breast: Secondary | ICD-10-CM | POA: Diagnosis not present

## 2023-11-30 DIAGNOSIS — Z803 Family history of malignant neoplasm of breast: Secondary | ICD-10-CM | POA: Diagnosis not present

## 2023-12-01 ENCOUNTER — Encounter: Payer: BC Managed Care – PPO | Admitting: Nurse Practitioner
